# Patient Record
Sex: Female | Born: 1985 | Race: White | Hispanic: No | Marital: Married | Smoking: Current every day smoker
Health system: Southern US, Community
[De-identification: ages and names within clinical notes are randomized; demographics above are authoritative.]

## PROBLEM LIST (undated history)

## (undated) DIAGNOSIS — Z8711 Personal history of peptic ulcer disease: Secondary | ICD-10-CM

## (undated) DIAGNOSIS — Z8719 Personal history of other diseases of the digestive system: Secondary | ICD-10-CM

## (undated) HISTORY — DX: Personal history of other diseases of the digestive system: Z87.19

## (undated) HISTORY — PX: KNEE ARTHROSCOPY: SUR90

## (undated) HISTORY — DX: Personal history of peptic ulcer disease: Z87.11

## (undated) HISTORY — PX: NECK SURGERY: SHX720

---

## 2015-09-21 DIAGNOSIS — Z30011 Encounter for initial prescription of contraceptive pills: Secondary | ICD-10-CM | POA: Diagnosis not present

## 2015-09-21 DIAGNOSIS — Z3009 Encounter for other general counseling and advice on contraception: Secondary | ICD-10-CM | POA: Diagnosis not present

## 2015-11-30 DIAGNOSIS — Z3041 Encounter for surveillance of contraceptive pills: Secondary | ICD-10-CM | POA: Diagnosis not present

## 2015-11-30 DIAGNOSIS — B373 Candidiasis of vulva and vagina: Secondary | ICD-10-CM | POA: Diagnosis not present

## 2015-11-30 DIAGNOSIS — Z113 Encounter for screening for infections with a predominantly sexual mode of transmission: Secondary | ICD-10-CM | POA: Diagnosis not present

## 2015-12-29 ENCOUNTER — Ambulatory Visit (INDEPENDENT_AMBULATORY_CARE_PROVIDER_SITE_OTHER): Payer: BLUE CROSS/BLUE SHIELD | Admitting: Family

## 2015-12-29 ENCOUNTER — Encounter (INDEPENDENT_AMBULATORY_CARE_PROVIDER_SITE_OTHER): Payer: Self-pay

## 2015-12-29 DIAGNOSIS — F909 Attention-deficit hyperactivity disorder, unspecified type: Secondary | ICD-10-CM | POA: Insufficient documentation

## 2015-12-29 DIAGNOSIS — F902 Attention-deficit hyperactivity disorder, combined type: Secondary | ICD-10-CM | POA: Diagnosis not present

## 2015-12-29 MED ORDER — LISDEXAMFETAMINE DIMESYLATE 30 MG PO CAPS: 30.0000 mg | ORAL_CAPSULE | Freq: Every day | ORAL | 0 refills | Status: DC

## 2015-12-29 NOTE — Patient Instructions (Addendum)

## 2015-12-29 NOTE — Progress Notes (Signed)
   Subjective:    Patient ID: Lorraine Snyder, female    DOB: 1985/07/13, 30 y.o.   MRN: [id number]  HPI Pt presents to the office today to establish care. Pt currently taking OC and doing well.  Pt states she was released from jail a few months ago and is trying to get a PCP. PT states in jail she was diagnosed with ADHD. PT reports she has never been on medication and as a child she never went to the doctor. She also states she self medicated with marijuana as a teen.   PT states she has a job and is doing well. PT states she is performing well as long as she has orders to do them, but when she has to fill out papers and make decisions it is hard for her to focus and stay on task. Pt states she can not multiple task at all.    Review of Systems  Psychiatric/Behavioral: Positive for decreased concentration.  All other systems reviewed and are negative.  Social History   Social History  . Marital status: Single    Spouse name: N/A  . Number of children: N/A  . Years of education: N/A   Social History Main Topics  . Smoking status: Not on file  . Smokeless tobacco: Not on file  . Alcohol use Not on file  . Drug use: Unknown  . Sexual activity: Not on file   Other Topics Concern  . Not on file   Social History Narrative  . No narrative on file    No family history on file.     Objective:   Physical Exam  Constitutional: She is oriented to person, place, and time. She appears well-developed and well-nourished. No distress.  HENT:  Head: Normocephalic and atraumatic.  Right Ear: External ear normal.  Left Ear: External ear normal.  Nose: Nose normal.  Mouth/Throat: Oropharynx is clear and moist.  Eyes: Pupils are equal, round, and reactive to light.  Neck: Normal range of motion. Neck supple. No thyromegaly present.  Cardiovascular: Normal rate, regular rhythm, normal heart sounds and intact distal pulses.   No murmur heard. Pulmonary/Chest: Effort normal and breath sounds  normal. No respiratory distress. She has no wheezes.  Abdominal: Soft. Bowel sounds are normal. She exhibits no distension. There is no tenderness.  Musculoskeletal: Normal range of motion. She exhibits no edema or tenderness.  Neurological: She is alert and oriented to person, place, and time.  Skin: Skin is warm and dry.  Psychiatric: [name]. Her behavior is normal. Judgment and thought content normal.  Vitals reviewed.     BP 122/84   Pulse 65   Temp 97.1 F (36.2 C) (Oral)   Ht 5\' 7"  (1.702 m)   Wt 172 lb 6.4 oz (78.2 kg)   BMI 27.00 kg/m      Assessment & Plan:  1. Attention deficit hyperactivity disorder (ADHD), combined type -Meds as prescribed Behavior modification as needed Follow-up for recheck in 1 months - lisdexamfetamine (VYVANSE) 30 MG capsule; Take 1 capsule (30 mg total) by mouth daily.  Dispense: 30 capsule; Refill: 0   Continue all meds Labs pending Health Maintenance reviewed Diet and exercise encouraged RTO 1 months  Evelina Dun, FNP

## 2016-01-30 ENCOUNTER — Encounter: Payer: Self-pay | Admitting: Family

## 2016-01-30 ENCOUNTER — Ambulatory Visit: Payer: BLUE CROSS/BLUE SHIELD | Admitting: Family

## 2016-01-30 ENCOUNTER — Ambulatory Visit (INDEPENDENT_AMBULATORY_CARE_PROVIDER_SITE_OTHER): Payer: BLUE CROSS/BLUE SHIELD | Admitting: Family

## 2016-01-30 VITALS — BP 124/73 | HR 78 | Temp 98.1°F | Ht 67.0 in | Wt 165.0 lb

## 2016-01-30 DIAGNOSIS — J0120 Acute ethmoidal sinusitis, unspecified: Secondary | ICD-10-CM

## 2016-01-30 DIAGNOSIS — F902 Attention-deficit hyperactivity disorder, combined type: Secondary | ICD-10-CM

## 2016-01-30 MED ORDER — FLUCONAZOLE 150 MG PO TABS: 150.0000 mg | ORAL_TABLET | Freq: Once | ORAL | 0 refills | Status: AC

## 2016-01-30 MED ORDER — AMOXICILLIN-POT CLAVULANATE 875-125 MG PO TABS: 1.0000 | ORAL_TABLET | Freq: Two times a day (BID) | ORAL | 0 refills | Status: DC

## 2016-01-30 MED ORDER — LISDEXAMFETAMINE DIMESYLATE 40 MG PO CAPS: 40.0000 mg | ORAL_CAPSULE | ORAL | 0 refills | Status: DC

## 2016-01-30 NOTE — Patient Instructions (Signed)

## 2016-01-30 NOTE — Progress Notes (Signed)
Subjective:    Patient ID: Lorraine Snyder, female    DOB: 28-Nov-1985, 30 y.o.   MRN: [id number]  PT presents to the office today to recheck ADHD. PT was started on Vyvanse 30 mg. PT states she is doing better with work and her boss could even tell that she was more focused.  URI   This is a new problem. The current episode started in the past 7 days. The problem has been waxing and waning. There has been no fever. Associated symptoms include congestion, coughing, headaches, a plugged ear sensation, rhinorrhea, sinus pain, sneezing, a sore throat and swollen glands. Pertinent negatives include no abdominal pain or ear pain. She has tried decongestant for the symptoms. The treatment provided mild relief.      Review of Systems  HENT: Positive for congestion, rhinorrhea, sinus pain, sneezing and sore throat. Negative for ear pain.   Respiratory: Positive for cough.   Gastrointestinal: Negative for abdominal pain.  Neurological: Positive for headaches.  All other systems reviewed and are negative.      Objective:   Physical Exam  Constitutional: She is oriented to person, place, and time. She appears well-developed and well-nourished. No distress.  HENT:  Head: Normocephalic and atraumatic.  Right Ear: External ear normal.  Left Ear: External ear normal.  Nose: Rhinorrhea present. Right sinus exhibits maxillary sinus tenderness. Left sinus exhibits maxillary sinus tenderness.  Mouth/Throat: Posterior oropharyngeal edema and posterior oropharyngeal erythema present.  Eyes: Pupils are equal, round, and reactive to light.  Neck: Normal range of motion. Neck supple. No thyromegaly present.  Cardiovascular: Normal rate, regular rhythm, normal heart sounds and intact distal pulses.   No murmur heard. Pulmonary/Chest: Effort normal and breath sounds normal. No respiratory distress. She has no wheezes.  Abdominal: Soft. Bowel sounds are normal. She exhibits no distension. There is no tenderness.    Musculoskeletal: Normal range of motion. She exhibits no edema or tenderness.  Neurological: She is alert and oriented to person, place, and time. She has normal reflexes. No cranial nerve deficit.  Skin: Skin is warm and dry.  Psychiatric: [name]. Her behavior is normal. Judgment and thought content normal.  Vitals reviewed.     BP 124/73   Pulse 78   Temp 98.1 F (36.7 C) (Oral)   Ht 5\' 7"  (1.702 m)   Wt 165 lb (74.8 kg)   BMI 25.84 kg/m      Assessment & Plan:  1. Attention deficit hyperactivity disorder (ADHD), combined type -PT's Vyvanse increased to 40 mg from 30 mg Meds as prescribed Behavior modification as needed Follow-up for recheck in 3 months - lisdexamfetamine (VYVANSE) 40 MG capsule; Take 1 capsule (40 mg total) by mouth every morning.  Dispense: 30 capsule; Refill: 0 - lisdexamfetamine (VYVANSE) 40 MG capsule; Take 1 capsule (40 mg total) by mouth every morning.  Dispense: 30 capsule; Refill: 0 - lisdexamfetamine (VYVANSE) 40 MG capsule; Take 1 capsule (40 mg total) by mouth every morning.  Dispense: 30 capsule; Refill: 0  2. Acute ethmoidal sinusitis, recurrence not specified - Take meds as prescribed - Use a cool mist humidifier  -Use saline nose sprays frequently -Saline irrigations of the nose can be very helpful if done frequently.  * 4X daily for 1 week*  * Use of a nettie pot can be helpful with this. Follow directions with this* -Force fluids -For any cough or congestion  Use plain Mucinex- regular strength or max strength is fine   *  Children- consult with Pharmacist for dosing -For fever or aces or pains- take tylenol or ibuprofen appropriate for age and weight.  * for fevers greater than 101 orally you may alternate ibuprofen and tylenol every  3 hours. -Throat lozenges if help - amoxicillin-clavulanate (AUGMENTIN) 875-125 MG tablet; Take 1 tablet by mouth 2 (two) times daily.  Dispense: 14 tablet; Refill:  0   Evelina Dun, FNP

## 2016-02-28 ENCOUNTER — Encounter: Payer: Self-pay | Admitting: Family

## 2016-03-03 ENCOUNTER — Other Ambulatory Visit: Payer: Self-pay | Admitting: Family

## 2016-03-03 DIAGNOSIS — F902 Attention-deficit hyperactivity disorder, combined type: Secondary | ICD-10-CM

## 2016-03-05 ENCOUNTER — Other Ambulatory Visit: Payer: Self-pay | Admitting: Family

## 2016-04-17 ENCOUNTER — Encounter: Payer: Self-pay | Admitting: Family

## 2016-05-03 ENCOUNTER — Encounter: Payer: Self-pay | Admitting: Family

## 2016-05-03 ENCOUNTER — Ambulatory Visit (INDEPENDENT_AMBULATORY_CARE_PROVIDER_SITE_OTHER): Payer: BLUE CROSS/BLUE SHIELD | Admitting: Family

## 2016-05-03 ENCOUNTER — Ambulatory Visit (INDEPENDENT_AMBULATORY_CARE_PROVIDER_SITE_OTHER): Payer: BLUE CROSS/BLUE SHIELD

## 2016-05-03 VITALS — BP 119/73 | HR 73 | Temp 98.4°F | Wt 166.2 lb

## 2016-05-03 DIAGNOSIS — M5440 Lumbago with sciatica, unspecified side: Secondary | ICD-10-CM | POA: Diagnosis not present

## 2016-05-03 DIAGNOSIS — R200 Anesthesia of skin: Secondary | ICD-10-CM

## 2016-05-03 DIAGNOSIS — R202 Paresthesia of skin: Secondary | ICD-10-CM

## 2016-05-03 DIAGNOSIS — F902 Attention-deficit hyperactivity disorder, combined type: Secondary | ICD-10-CM

## 2016-05-03 MED ORDER — CYCLOBENZAPRINE HCL 10 MG PO TABS: 10.0000 mg | ORAL_TABLET | Freq: Three times a day (TID) | ORAL | 0 refills | Status: DC | PRN

## 2016-05-03 MED ORDER — GABAPENTIN 300 MG PO CAPS: 300.0000 mg | ORAL_CAPSULE | Freq: Three times a day (TID) | ORAL | 3 refills | Status: DC

## 2016-05-03 MED ORDER — LISDEXAMFETAMINE DIMESYLATE 50 MG PO CAPS: 50.0000 mg | ORAL_CAPSULE | Freq: Every day | ORAL | 0 refills | Status: DC

## 2016-05-03 MED ORDER — NAPROXEN 500 MG PO TABS: 500.0000 mg | ORAL_TABLET | Freq: Two times a day (BID) | ORAL | 1 refills | Status: DC

## 2016-05-03 NOTE — Progress Notes (Signed)
Subjective:    Patient ID: Lorraine Snyder, female    DOB: March 17, 1985, 31 y.o.   MRN: [id number]  HPI PT presents to the office today to recheck ADHD. PT was started on Vyvanse 40 mg. PT states she is doing better with work and her boss could even tell that she was more focused. PT states she could tell a difference when we first increased it, but over the last few weeks she has noticed being more hyperactive. States she is doing well in her job. Denies any weight changes or sleep changes.   Pt complaining of bilateral intermittent numbness and burning. Pt states this has been going on for over a year, but has become worse. Pt complaining of lower back pain.    Review of Systems  All other systems reviewed and are negative.      Objective:   Physical Exam  Constitutional: She is oriented to person, place, and time. She appears well-developed and well-nourished. No distress.  HENT:  Head: Normocephalic and atraumatic.  Right Ear: External ear normal.  Left Ear: External ear normal.  Eyes: Pupils are equal, round, and reactive to light.  Neck: Normal range of motion. Neck supple. No thyromegaly present.  Cardiovascular: Normal rate, regular rhythm, normal heart sounds and intact distal pulses.   No murmur heard. Pulmonary/Chest: Effort normal and breath sounds normal. No respiratory distress. She has no wheezes.  Abdominal: Soft. Bowel sounds are normal. She exhibits no distension. There is no tenderness.  Musculoskeletal: Normal range of motion. She exhibits no edema or tenderness.  Neurological: She is alert and oriented to person, place, and time. She has normal reflexes. No cranial nerve deficit.  Skin: Skin is warm and dry.  Psychiatric: [name]. Her behavior is normal. Judgment and thought content normal.  Vitals reviewed.     BP 119/73   Pulse 73   Temp 98.4 F (36.9 C) (Oral)   Wt 166 lb 3.2 oz (75.4 kg)   LMP  (Exact Date)   BMI 26.03 kg/m      Assessment & Plan:  1. Attention deficit hyperactivity disorder (ADHD), combined type -Vyvanse increased to 50 mg from 40 mg Meds as prescribed Behavior modification as needed Follow-up for recheck in 3 months - lisdexamfetamine (VYVANSE) 50 MG capsule; Take 1 capsule (50 mg total) by mouth daily.  Dispense: 30 capsule; Refill: 0 - lisdexamfetamine (VYVANSE) 50 MG capsule; Take 1 capsule (50 mg total) by mouth daily.  Dispense: 30 capsule; Refill: 0 - lisdexamfetamine (VYVANSE) 50 MG capsule; Take 1 capsule (50 mg total) by mouth daily.  Dispense: 30 capsule; Refill: 0   2. Acute bilateral low back pain with sciatica, sciatica laterality unspecified - DG Lumbar Spine 2-3 Views; Future - naproxen (NAPROSYN) 500 MG tablet; Take 1 tablet (500 mg total) by mouth 2 (two) times daily with a meal.  Dispense: 60 tablet; Refill: 1 - cyclobenzaprine (FLEXERIL) 10 MG tablet; Take 1 tablet (10 mg total) by mouth 3 (three) times daily as needed for muscle spasms.  Dispense: 30 tablet; Refill: 0 - gabapentin (NEURONTIN) 300 MG capsule; Take 1 capsule (300 mg total) by mouth 3 (three) times daily.  Dispense: 90 capsule; Refill: 3  3. Numbness and tingling of both feet - DG Lumbar Spine 2-3 Views; Future - gabapentin (NEURONTIN) 300 MG capsule; Take 1 capsule (300 mg total) by mouth 3 (three) times daily.  Dispense: 90 capsule; Refill: 3    Rest Ice and  heat No other NSAIDS  Started pt on naprosyn and gabapentin RTO in 3 months if back pain does not improve may need CT scan?  Evelina Dun, FNP

## 2016-05-03 NOTE — Patient Instructions (Signed)

## 2016-06-02 ENCOUNTER — Encounter: Payer: Self-pay | Admitting: Family

## 2016-06-04 ENCOUNTER — Other Ambulatory Visit: Payer: Self-pay | Admitting: Family

## 2016-06-04 MED ORDER — VALACYCLOVIR HCL 1 G PO TABS: 2000.0000 mg | ORAL_TABLET | Freq: Two times a day (BID) | ORAL | 1 refills | Status: DC

## 2016-06-06 ENCOUNTER — Encounter: Payer: Self-pay | Admitting: Family

## 2016-06-06 MED ORDER — FLUCONAZOLE 150 MG PO TABS: 150.0000 mg | ORAL_TABLET | Freq: Once | ORAL | 0 refills | Status: AC

## 2016-06-30 ENCOUNTER — Other Ambulatory Visit: Payer: Self-pay | Admitting: Family

## 2016-06-30 DIAGNOSIS — M5440 Lumbago with sciatica, unspecified side: Secondary | ICD-10-CM

## 2016-07-30 ENCOUNTER — Other Ambulatory Visit: Payer: Self-pay | Admitting: Family

## 2016-07-30 DIAGNOSIS — M5440 Lumbago with sciatica, unspecified side: Secondary | ICD-10-CM

## 2016-08-03 ENCOUNTER — Ambulatory Visit: Payer: BLUE CROSS/BLUE SHIELD | Admitting: Family

## 2016-08-07 ENCOUNTER — Ambulatory Visit (INDEPENDENT_AMBULATORY_CARE_PROVIDER_SITE_OTHER): Payer: BLUE CROSS/BLUE SHIELD

## 2016-08-07 ENCOUNTER — Ambulatory Visit (INDEPENDENT_AMBULATORY_CARE_PROVIDER_SITE_OTHER): Payer: BLUE CROSS/BLUE SHIELD | Admitting: Family

## 2016-08-07 ENCOUNTER — Encounter: Payer: Self-pay | Admitting: Family

## 2016-08-07 VITALS — BP 103/68 | HR 67 | Temp 98.3°F | Ht 67.0 in | Wt 156.8 lb

## 2016-08-07 DIAGNOSIS — F17200 Nicotine dependence, unspecified, uncomplicated: Secondary | ICD-10-CM | POA: Insufficient documentation

## 2016-08-07 DIAGNOSIS — F902 Attention-deficit hyperactivity disorder, combined type: Secondary | ICD-10-CM | POA: Diagnosis not present

## 2016-08-07 DIAGNOSIS — G8929 Other chronic pain: Secondary | ICD-10-CM

## 2016-08-07 DIAGNOSIS — M25561 Pain in right knee: Secondary | ICD-10-CM

## 2016-08-07 DIAGNOSIS — G5730 Lesion of lateral popliteal nerve, unspecified lower limb: Secondary | ICD-10-CM | POA: Insufficient documentation

## 2016-08-07 DIAGNOSIS — G629 Polyneuropathy, unspecified: Secondary | ICD-10-CM | POA: Insufficient documentation

## 2016-08-07 DIAGNOSIS — N912 Amenorrhea, unspecified: Secondary | ICD-10-CM | POA: Diagnosis not present

## 2016-08-07 DIAGNOSIS — G6289 Other specified polyneuropathies: Secondary | ICD-10-CM

## 2016-08-07 LAB — PREGNANCY, URINE: PREG TEST UR: POSITIVE — AB

## 2016-08-07 MED ORDER — LISDEXAMFETAMINE DIMESYLATE 50 MG PO CAPS: 50.0000 mg | ORAL_CAPSULE | Freq: Every day | ORAL | 0 refills | Status: DC

## 2016-08-07 MED ORDER — GABAPENTIN 400 MG PO CAPS: 400.0000 mg | ORAL_CAPSULE | Freq: Three times a day (TID) | ORAL | 2 refills | Status: DC

## 2016-08-07 NOTE — Patient Instructions (Signed)
Secondary Amenorrhea Secondary amenorrhea is the stopping of menstrual flow for 3-6 months in a female who has previously had periods. There are many possible causes. Most of these causes are not serious. Usually, treating the underlying problem causing the loss of menses will return your periods to normal. What are the causes? Some common and uncommon causes of not menstruating include:  Malnutrition.  Low blood sugar (hypoglycemia).  Polycystic ovary disease.  Stress or fear.  Breastfeeding.  Hormone imbalance.  Ovarian failure.  Medicines.  Extreme obesity.  Cystic fibrosis.  Low body weight or drastic weight reduction from any cause.  Early menopause.  Removal of ovaries or uterus.  Contraceptives.  Illness.  Long-term (chronic) illnesses.  Cushing syndrome.  Thyroid problems.  Birth control pills, patches, or vaginal rings for birth control.  What increases the risk? You may be at greater risk of secondary amenorrhea if:  You have a family history of this condition.  You have an eating disorder.  You do athletic training.  How is this diagnosed? A diagnosis is made by your health care provider taking a medical history and doing a physical exam. This will include a pelvic exam to check for problems with your reproductive organs. Pregnancy must be ruled out. Often, numerous blood tests are done to measure different hormones in the body. Urine testing may be done. Specialized exams (ultrasound, CT scan, MRI, or hysteroscopy) may have to be done as well as measuring the body mass index (BMI). How is this treated? Treatment depends on the cause of the amenorrhea. If an eating disorder is present, this can be treated with an adequate diet and therapy. Chronic illnesses may improve with treatment of the illness. Amenorrhea may be corrected with medicines, lifestyle changes, or surgery. If the amenorrhea cannot be corrected, it is sometimes possible to create a  false menstruation with medicines. Follow these instructions at home:  Maintain a healthy diet.  Manage weight problems.  Exercise regularly but not excessively.  Get adequate sleep.  Manage stress.  Be aware of changes in your menstrual cycle. Keep a record of when your periods occur. Note the date your period starts, how long it lasts, and any problems. Contact a health care provider if: Your symptoms do not get better with treatment. This information is not intended to replace advice given to you by your health care provider. Make sure you discuss any questions you have with your health care provider. Document Released: 03/12/2006 Document Revised: 07/07/2015 Document Reviewed: 07/17/2012 Elsevier Interactive Patient Education  2018 [employer]  
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
## 2016-08-08 ENCOUNTER — Other Ambulatory Visit: Payer: Self-pay | Admitting: Family

## 2016-08-08 DIAGNOSIS — G8929 Other chronic pain: Secondary | ICD-10-CM

## 2016-08-08 DIAGNOSIS — M25561 Pain in right knee: Principal | ICD-10-CM

## 2016-08-08 DIAGNOSIS — R9389 Abnormal findings on diagnostic imaging of other specified body structures: Secondary | ICD-10-CM

## 2016-08-10 ENCOUNTER — Encounter: Payer: Self-pay | Admitting: Family

## 2016-08-16 ENCOUNTER — Encounter: Payer: Self-pay | Admitting: Family

## 2016-08-21 ENCOUNTER — Encounter: Payer: Self-pay | Admitting: Family

## 2016-08-21 ENCOUNTER — Ambulatory Visit (INDEPENDENT_AMBULATORY_CARE_PROVIDER_SITE_OTHER): Payer: BLUE CROSS/BLUE SHIELD | Admitting: Family

## 2016-08-21 VITALS — BP 126/86 | HR 79 | Temp 99.0°F | Ht 67.0 in | Wt 154.6 lb

## 2016-08-21 DIAGNOSIS — R1032 Left lower quadrant pain: Secondary | ICD-10-CM

## 2016-08-21 DIAGNOSIS — R300 Dysuria: Secondary | ICD-10-CM

## 2016-08-21 DIAGNOSIS — O0380 Unspecified complication following complete or unspecified spontaneous abortion: Secondary | ICD-10-CM

## 2016-08-21 LAB — URINALYSIS, COMPLETE
BILIRUBIN UA: NEGATIVE
GLUCOSE, UA: NEGATIVE
KETONES UA: NEGATIVE
Leukocytes, UA: NEGATIVE
NITRITE UA: NEGATIVE
PROTEIN UA: NEGATIVE
SPEC GRAV UA: 1.01 (ref 1.005–1.030)
UUROB: 0.2 mg/dL (ref 0.2–1.0)
pH, UA: 7 (ref 5.0–7.5)

## 2016-08-21 LAB — MICROSCOPIC EXAMINATION
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
## 2016-08-21 NOTE — Patient Instructions (Addendum)
Prostaglandin-Induced Abortion, Care After Refer to this sheet in the next few weeks. These instructions provide you with information on caring for yourself after your procedure. Your health care provider may also give you more specific instructions. Your treatment has been planned according to current medical practices, but problems sometimes occur. Call your health care provider if you have any problems or questions after your procedure. What can I expect after the procedure?  You may have cramps for a few days. They may feel like menstrual cramps.  You may bleed for a few hours or a few days. It may feel like you are having a heavy period.  You may have a headache, diarrhea, or chills for 1-2 days.  You may feel tired and depressed.  Your next menstrual period will most likely start 4-6 weeks after the procedure, unless your health care provider has started you on birth control pills. Follow these instructions at home:  Only take over-the-counter or prescription medicines as directed by your health care provider.  Only take the medicines your health care provider recommends. Do not take aspirin. It can cause bleeding.  Keep track of how many menstrual pads you use each day and how soaked they are. Write down this information.  Rest and avoid strenuous activity for 2-3 weeks.  Do not have sexual intercourse for 2-3 weeks or until your health care provider says it is okay.  Do not douche or use tampons.  Ask your health care provider when you can start using birth control (contraception).  Keep all your follow-up appointments. Contact a health care provider if:  You have chills or fever.  You need to change your pad more often than every 2-4 hours.  You continue to have pain.  You have vaginal drainage.  You have pain or bleeding that gets worse. Get help right away if:  You have very bad cramps in your stomach, back, or abdomen.  You have a fever.  There are large  blood clots or tissue coming out of your vagina. Save any tissue for your health care provider to inspect.  You need to change your pad every hour or more than once in an hour.  You become light-headed, weak, or faint. This information is not intended to replace advice given to you by your health care provider. Make sure you discuss any questions you have with your health care provider. Document Released: 02/03/2013 Document Revised: 07/07/2015 Document Reviewed: 12/24/2012 Elsevier Interactive Patient Education  [employer].

## 2016-08-23 ENCOUNTER — Other Ambulatory Visit: Payer: Self-pay | Admitting: Family

## 2016-08-23 LAB — URINE CULTURE

## 2016-08-23 LAB — SPECIMEN STATUS REPORT

## 2016-08-23 LAB — PREGNANCY, URINE: PREG TEST UR: POSITIVE — AB

## 2016-08-24 ENCOUNTER — Ambulatory Visit: Payer: BLUE CROSS/BLUE SHIELD | Admitting: Family

## 2016-08-24 ENCOUNTER — Other Ambulatory Visit: Payer: Self-pay | Admitting: Family

## 2016-08-24 ENCOUNTER — Telehealth: Payer: Self-pay | Admitting: Family

## 2016-08-24 MED ORDER — FLUCONAZOLE 150 MG PO TABS: 150.0000 mg | ORAL_TABLET | ORAL | 0 refills | Status: DC | PRN

## 2016-08-24 MED ORDER — CIPROFLOXACIN HCL 500 MG PO TABS: 500.0000 mg | ORAL_TABLET | Freq: Two times a day (BID) | ORAL | 0 refills | Status: DC

## 2016-08-24 NOTE — Telephone Encounter (Signed)
Prescription sent to pharmacy.

## 2016-08-24 NOTE — Progress Notes (Signed)
c 

## 2016-08-24 NOTE — Telephone Encounter (Signed)
What is the name of the medication? Diflucan  Have you contacted your pharmacy to request a refill? NO  Which pharmacy would you like this sent to? [facility] in [place]   Patient notified that their request is being sent to the clinical staff for review and that they should receive a call once it is complete. If they do not receive a call within 24 hours they can check with their pharmacy or our office.

## 2016-08-28 ENCOUNTER — Encounter: Payer: Self-pay | Admitting: Orthopedic Surgery

## 2016-08-28 ENCOUNTER — Ambulatory Visit (INDEPENDENT_AMBULATORY_CARE_PROVIDER_SITE_OTHER): Payer: BLUE CROSS/BLUE SHIELD | Admitting: Orthopedic Surgery

## 2016-08-28 VITALS — BP 120/76 | HR 75 | Temp 97.8°F | Ht 68.0 in | Wt 155.0 lb

## 2016-08-28 DIAGNOSIS — D492 Neoplasm of unspecified behavior of bone, soft tissue, and skin: Secondary | ICD-10-CM | POA: Diagnosis not present

## 2016-08-28 DIAGNOSIS — M4120 Other idiopathic scoliosis, site unspecified: Secondary | ICD-10-CM

## 2016-08-28 NOTE — Addendum Note (Signed)
Addended by: Moreen Fowler R on: 08/28/2016 04:56 PM   Modules accepted: Orders

## 2016-08-28 NOTE — Progress Notes (Signed)
  NEW PATIENT OFFICE VISIT    Chief Complaint  Patient presents with  . New Patient (Initial Visit)    Referred by Dr. Luan Pulling for right knee pain    31 year old female with a long history of her prior knee injury in which road gravel was trapped under her skin and precipitously continue to come out over several years. She now presents with pain in the tibial tubercle region as well as a bone lesion on x-ray. She complains of deep medial and lateral knee pain occasionally associated with swelling which is best described as a deep dull ache rash also has a history of scoliosis which was never treated brought on by a growth spurt when she was in high school which was an exacerbation of her smaller curve    Review of Systems  Musculoskeletal: Positive for joint pain.  Neurological: Negative for tingling, sensory change and focal weakness.     Past Medical History:  Diagnosis Date  . History of stomach ulcers     Past Surgical History:  Procedure Laterality Date  . KNEE ARTHROSCOPY      Family History  Problem Relation Age of Onset  . Arthritis Father   . Diabetes Father   . Diabetes Sister   . Diabetes Maternal Grandmother    Social History  Substance Use Topics  . Smoking status: Current Every Day Smoker    Types: E-cigarettes  . Smokeless tobacco: Never Used  . Alcohol use 3.6 oz/week    6 Cans of beer per week    BP 120/76   Pulse 75   Temp 97.8 F (36.6 C)   Ht 5\' 8"  (1.727 m)   Wt 155 lb (70.3 kg)   BMI 23.57 kg/m   Physical Exam  Constitutional: She is oriented to person, place, and time. She appears well-developed and well-nourished.  Neurological: She is alert and oriented to person, place, and time.  Psychiatric: [name].  Vitals reviewed.   Ortho Exam Coronal plane scoliosis and a rib hump is noted  Left knee is stable full range of motion no tenderness or swelling normal motor exam skin is intact no lesions no skin  discolorations and neurovascular exam normal  Right knee has a mass and tenderness over the tibial tubercle which is old and from the road rash problem  The knee feels stable and no feeling of meniscal pathology motor exam is normal her flexion causes some discomfort over the tibial tubercle skin was otherwise normal pulse was intact and sensation was normal   MRI to evaluate this bone lesion and also we can probably see if there are any foreign bodies in the skin overlying the tibial tubercle she will follow-up after the MRI is done No orders of the defined types were placed in this encounter.   Encounter Diagnosis  Name Primary?  . Bone tumor Yes     PLAN:

## 2016-08-30 LAB — CREATININE, SERUM: CREATININE: 0.92 mg/dL (ref 0.50–1.10)

## 2016-08-31 ENCOUNTER — Ambulatory Visit (HOSPITAL_COMMUNITY)
Admission: RE | Admit: 2016-08-31 | Discharge: 2016-08-31 | Disposition: A | Discharge status: Home / Self Care | Payer: BLUE CROSS/BLUE SHIELD | Source: Ambulatory Visit | Attending: Orthopedic Surgery | Admitting: Orthopedic Surgery

## 2016-08-31 DIAGNOSIS — D492 Neoplasm of unspecified behavior of bone, soft tissue, and skin: Secondary | ICD-10-CM | POA: Diagnosis not present

## 2016-08-31 DIAGNOSIS — M7121 Synovial cyst of popliteal space [Baker], right knee: Secondary | ICD-10-CM | POA: Insufficient documentation

## 2016-08-31 DIAGNOSIS — M25461 Effusion, right knee: Secondary | ICD-10-CM | POA: Diagnosis not present

## 2016-08-31 MED ORDER — GADOBENATE DIMEGLUMINE 529 MG/ML IV SOLN
15.0000 mL | Freq: Once | INTRAVENOUS | Status: AC | PRN
  Administered 2016-08-31: 14 mL via INTRAVENOUS

## 2016-09-03 ENCOUNTER — Ambulatory Visit (INDEPENDENT_AMBULATORY_CARE_PROVIDER_SITE_OTHER): Payer: BLUE CROSS/BLUE SHIELD | Admitting: Orthopedic Surgery

## 2016-09-03 ENCOUNTER — Encounter: Payer: Self-pay | Admitting: Orthopedic Surgery

## 2016-09-03 ENCOUNTER — Other Ambulatory Visit: Payer: Self-pay | Admitting: Family

## 2016-09-03 DIAGNOSIS — M9251 Juvenile osteochondrosis of tibia and fibula, right leg: Secondary | ICD-10-CM | POA: Diagnosis not present

## 2016-09-03 DIAGNOSIS — M5440 Lumbago with sciatica, unspecified side: Secondary | ICD-10-CM

## 2016-09-03 DIAGNOSIS — D492 Neoplasm of unspecified behavior of bone, soft tissue, and skin: Secondary | ICD-10-CM | POA: Diagnosis not present

## 2016-09-03 DIAGNOSIS — M1711 Unilateral primary osteoarthritis, right knee: Secondary | ICD-10-CM | POA: Diagnosis not present

## 2016-09-03 DIAGNOSIS — M4120 Other idiopathic scoliosis, site unspecified: Secondary | ICD-10-CM | POA: Diagnosis not present

## 2016-09-03 DIAGNOSIS — M92521 Juvenile osteochondrosis of tibia tubercle, right leg: Secondary | ICD-10-CM

## 2016-09-03 MED ORDER — NABUMETONE 500 MG PO TABS: 500.0000 mg | ORAL_TABLET | Freq: Two times a day (BID) | ORAL | 5 refills | Status: DC

## 2016-09-03 NOTE — Progress Notes (Signed)
This is a follow-up visit  Today we are going to review an MRI report and result  Are also going to address the osteoarthritis of Lorraine Snyder knee the bone tumor in the tibial tubercle prominence  This is a 31 year old female she was involved in some type of accident where gravel became entrapped in the skin over the tibial tubercle. There is a long period of gravel having to be removed from the knee and now she has a large prominence there with some pain however, Lorraine Snyder main pain is in the joint. The and it's achy  She also has some history of numbness and tingling hands and feet which has been relieved well with gabapentin and no side effects  On review of systems we have nothing to add from Lorraine Snyder prior visit on July 17  I have this interpretation of the MRI The MRI shows a prominent tibial tubercle probable Osgood-Schlatter's disease. There is mild 3 compartment arthrosis  The MRI report reads IMPRESSION: 1. The lesion of concern in the distal femoral metaphysis is a benign fibrous cortical defect and requires no further workup. 2. There is evidence of remote Osgood-Schlatter disease, with an ossicle in the distal patellar tendon. The ossicle is surrounded by some edema which could reflect some low-grade chronic inflammation. 3. Mild tricompartmental spurring. 4. 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 5. Borderline elevated tibial tubercle -trochlear groove distance at 1.7 cm. 6. Small Baker's cyst. 7. Today' s exam was performed using the soft tissue mass protocol and not the orthopedic protocol. As result, the menisci are not assessed in a diagnostic fashion.     Electronically Signed   By: Van Clines M.D.   On: 08/31/2016 20:05     Baker's cyst  No meniscal tear and a normal bone lesion consistent with fibrous cortical defect  Results have been reviewed with  the patient  She has a history of gastritis and therefore she monitors closely Lorraine Snyder NSAID intake  However, with Lorraine Snyder degree of arthritis Lorraine Snyder age and activity level NSAID therapy is about all that we can do at this point  Remove the tibial tubercle we will have to address the patellar tendon and will probably have to cut it and she will be out of work 6 weeks  She says she can't do that and besides Lorraine Snyder knee joint aching is more symptomatic  Of course that is more difficult to address surgically and should be addressed medically and therefore we are putting Lorraine Snyder on Relafen 500 mg twice a day  She will follow-up when she can't stand the pain anymore and then perhaps we can address the tibial tubercle issue.  Opiate therapy should be avoided. If  the GI tract becomes an issue then she will have to take Prilosec or proton pump inhibitor of some kind with the medicine and have Lorraine Snyder hemoglobin monitored and stools monitored for occult bleeding   Encounter Diagnoses  Name Primary?  . Bone tumor Yes  . Scoliosis (and kyphoscoliosis), idiopathic   . Primary osteoarthritis of right knee   . Osgood-Schlatter's disease of right lower extremity

## 2016-09-17 ENCOUNTER — Other Ambulatory Visit: Payer: Self-pay | Admitting: Family

## 2016-09-17 DIAGNOSIS — M5440 Lumbago with sciatica, unspecified side: Secondary | ICD-10-CM

## 2016-10-04 DIAGNOSIS — M4312 Spondylolisthesis, cervical region: Secondary | ICD-10-CM | POA: Diagnosis not present

## 2016-10-04 DIAGNOSIS — M545 Low back pain: Secondary | ICD-10-CM | POA: Diagnosis not present

## 2016-10-04 DIAGNOSIS — M542 Cervicalgia: Secondary | ICD-10-CM | POA: Diagnosis not present

## 2016-10-04 DIAGNOSIS — M419 Scoliosis, unspecified: Secondary | ICD-10-CM | POA: Diagnosis not present

## 2016-10-04 DIAGNOSIS — M4125 Other idiopathic scoliosis, thoracolumbar region: Secondary | ICD-10-CM | POA: Diagnosis not present

## 2016-10-08 ENCOUNTER — Other Ambulatory Visit: Payer: Self-pay | Admitting: Rehabilitation

## 2016-10-08 DIAGNOSIS — M4310 Spondylolisthesis, site unspecified: Secondary | ICD-10-CM

## 2016-10-08 DIAGNOSIS — M542 Cervicalgia: Secondary | ICD-10-CM

## 2016-10-10 ENCOUNTER — Ambulatory Visit
Admission: RE | Admit: 2016-10-10 | Discharge: 2016-10-10 | Disposition: A | Discharge status: Home / Self Care | Payer: BLUE CROSS/BLUE SHIELD | Source: Ambulatory Visit | Attending: Rehabilitation | Admitting: Rehabilitation

## 2016-10-10 DIAGNOSIS — M542 Cervicalgia: Secondary | ICD-10-CM

## 2016-10-10 DIAGNOSIS — M4310 Spondylolisthesis, site unspecified: Secondary | ICD-10-CM

## 2016-10-10 DIAGNOSIS — M4802 Spinal stenosis, cervical region: Secondary | ICD-10-CM | POA: Diagnosis not present

## 2016-10-18 DIAGNOSIS — M25512 Pain in left shoulder: Secondary | ICD-10-CM | POA: Diagnosis not present

## 2016-10-18 DIAGNOSIS — M5412 Radiculopathy, cervical region: Secondary | ICD-10-CM | POA: Diagnosis not present

## 2016-10-18 DIAGNOSIS — M4312 Spondylolisthesis, cervical region: Secondary | ICD-10-CM | POA: Diagnosis not present

## 2016-10-18 DIAGNOSIS — M4125 Other idiopathic scoliosis, thoracolumbar region: Secondary | ICD-10-CM | POA: Diagnosis not present

## 2016-10-23 DIAGNOSIS — M50323 Other cervical disc degeneration at C6-C7 level: Secondary | ICD-10-CM | POA: Diagnosis not present

## 2016-10-23 DIAGNOSIS — M9905 Segmental and somatic dysfunction of pelvic region: Secondary | ICD-10-CM | POA: Diagnosis not present

## 2016-10-23 DIAGNOSIS — Q72812 Congenital shortening of left lower limb: Secondary | ICD-10-CM | POA: Diagnosis not present

## 2016-10-23 DIAGNOSIS — M9901 Segmental and somatic dysfunction of cervical region: Secondary | ICD-10-CM | POA: Diagnosis not present

## 2016-10-25 DIAGNOSIS — M9905 Segmental and somatic dysfunction of pelvic region: Secondary | ICD-10-CM | POA: Diagnosis not present

## 2016-10-25 DIAGNOSIS — M50323 Other cervical disc degeneration at C6-C7 level: Secondary | ICD-10-CM | POA: Diagnosis not present

## 2016-10-25 DIAGNOSIS — M9901 Segmental and somatic dysfunction of cervical region: Secondary | ICD-10-CM | POA: Diagnosis not present

## 2016-10-25 DIAGNOSIS — Q72812 Congenital shortening of left lower limb: Secondary | ICD-10-CM | POA: Diagnosis not present

## 2016-10-30 DIAGNOSIS — Q72812 Congenital shortening of left lower limb: Secondary | ICD-10-CM | POA: Diagnosis not present

## 2016-10-30 DIAGNOSIS — M9901 Segmental and somatic dysfunction of cervical region: Secondary | ICD-10-CM | POA: Diagnosis not present

## 2016-10-30 DIAGNOSIS — M50323 Other cervical disc degeneration at C6-C7 level: Secondary | ICD-10-CM | POA: Diagnosis not present

## 2016-10-30 DIAGNOSIS — M9905 Segmental and somatic dysfunction of pelvic region: Secondary | ICD-10-CM | POA: Diagnosis not present

## 2016-11-01 DIAGNOSIS — M9901 Segmental and somatic dysfunction of cervical region: Secondary | ICD-10-CM | POA: Diagnosis not present

## 2016-11-01 DIAGNOSIS — Q72812 Congenital shortening of left lower limb: Secondary | ICD-10-CM | POA: Diagnosis not present

## 2016-11-01 DIAGNOSIS — M50323 Other cervical disc degeneration at C6-C7 level: Secondary | ICD-10-CM | POA: Diagnosis not present

## 2016-11-01 DIAGNOSIS — M9905 Segmental and somatic dysfunction of pelvic region: Secondary | ICD-10-CM | POA: Diagnosis not present

## 2016-11-02 ENCOUNTER — Other Ambulatory Visit: Payer: Self-pay | Admitting: Family

## 2016-11-02 DIAGNOSIS — G6289 Other specified polyneuropathies: Secondary | ICD-10-CM

## 2016-11-06 DIAGNOSIS — Q72812 Congenital shortening of left lower limb: Secondary | ICD-10-CM | POA: Diagnosis not present

## 2016-11-06 DIAGNOSIS — M50323 Other cervical disc degeneration at C6-C7 level: Secondary | ICD-10-CM | POA: Diagnosis not present

## 2016-11-06 DIAGNOSIS — M9905 Segmental and somatic dysfunction of pelvic region: Secondary | ICD-10-CM | POA: Diagnosis not present

## 2016-11-06 DIAGNOSIS — M9901 Segmental and somatic dysfunction of cervical region: Secondary | ICD-10-CM | POA: Diagnosis not present

## 2016-11-07 ENCOUNTER — Telehealth: Payer: Self-pay | Admitting: Family

## 2016-11-07 ENCOUNTER — Ambulatory Visit: Payer: BLUE CROSS/BLUE SHIELD | Admitting: Family

## 2016-11-07 DIAGNOSIS — F902 Attention-deficit hyperactivity disorder, combined type: Secondary | ICD-10-CM

## 2016-11-08 ENCOUNTER — Encounter: Payer: Self-pay | Admitting: Family

## 2016-11-08 MED ORDER — LISDEXAMFETAMINE DIMESYLATE 50 MG PO CAPS: 50.0000 mg | ORAL_CAPSULE | Freq: Every day | ORAL | 0 refills | Status: DC

## 2016-11-08 NOTE — Telephone Encounter (Signed)
Patient aware that rx is up front to be picked up.

## 2016-11-08 NOTE — Telephone Encounter (Signed)
RX ready for pick up 

## 2016-11-15 DIAGNOSIS — M9905 Segmental and somatic dysfunction of pelvic region: Secondary | ICD-10-CM | POA: Diagnosis not present

## 2016-11-15 DIAGNOSIS — M50323 Other cervical disc degeneration at C6-C7 level: Secondary | ICD-10-CM | POA: Diagnosis not present

## 2016-11-15 DIAGNOSIS — Q72812 Congenital shortening of left lower limb: Secondary | ICD-10-CM | POA: Diagnosis not present

## 2016-11-15 DIAGNOSIS — M9901 Segmental and somatic dysfunction of cervical region: Secondary | ICD-10-CM | POA: Diagnosis not present

## 2016-11-19 ENCOUNTER — Ambulatory Visit (INDEPENDENT_AMBULATORY_CARE_PROVIDER_SITE_OTHER): Payer: BLUE CROSS/BLUE SHIELD | Admitting: Family

## 2016-11-19 ENCOUNTER — Encounter: Payer: Self-pay | Admitting: Family

## 2016-11-19 VITALS — BP 98/64 | HR 64 | Temp 97.8°F | Ht 68.0 in | Wt 154.4 lb

## 2016-11-19 DIAGNOSIS — Z3009 Encounter for other general counseling and advice on contraception: Secondary | ICD-10-CM | POA: Diagnosis not present

## 2016-11-19 DIAGNOSIS — F17200 Nicotine dependence, unspecified, uncomplicated: Secondary | ICD-10-CM | POA: Diagnosis not present

## 2016-11-19 DIAGNOSIS — F902 Attention-deficit hyperactivity disorder, combined type: Secondary | ICD-10-CM

## 2016-11-19 DIAGNOSIS — L255 Unspecified contact dermatitis due to plants, except food: Secondary | ICD-10-CM

## 2016-11-19 MED ORDER — TRIAMCINOLONE ACETONIDE 0.5 % EX OINT: 1.0000 "application " | TOPICAL_OINTMENT | Freq: Two times a day (BID) | CUTANEOUS | 0 refills | Status: DC

## 2016-11-19 MED ORDER — LISDEXAMFETAMINE DIMESYLATE 60 MG PO CAPS: 60.0000 mg | ORAL_CAPSULE | ORAL | 0 refills | Status: DC

## 2016-11-19 MED ORDER — PREDNISONE 10 MG (21) PO TBPK: ORAL_TABLET | ORAL | 0 refills | Status: DC

## 2016-11-19 NOTE — Progress Notes (Signed)
   Subjective:    Patient ID: Lorraine Snyder, female    DOB: 1985-04-17, 31 y.o.   MRN: [id number]  Pt presents to the office today for ADHD follow up. PT is currently taking Vyvanse 50 mg daily. States this is helps greatly with her staying on task and staying focus at work, but states she would like to try to increase the dose. Denies any weight loss or adverse effects.  Rash  This is a new problem. The current episode started more than 1 month ago. The problem has been waxing and waning since onset. The affected locations include the right hand, right wrist and right upper leg. The rash is characterized by itchiness and redness. She was exposed to plant contact. Past treatments include anti-itch cream. The treatment provided mild relief.      Review of Systems  Skin: Positive for rash.  All other systems reviewed and are negative.      Objective:   Physical Exam  Constitutional: She is oriented to person, place, and time. She appears well-developed and well-nourished. No distress.  HENT:  Head: Normocephalic and atraumatic.  Eyes: Pupils are equal, round, and reactive to light.  Neck: Normal range of motion. Neck supple. No thyromegaly present.  Cardiovascular: Normal rate, regular rhythm, normal heart sounds and intact distal pulses.   No murmur heard. Pulmonary/Chest: Effort normal and breath sounds normal. No respiratory distress. She has no wheezes.  Abdominal: Soft. Bowel sounds are normal. She exhibits no distension. There is no tenderness.  Musculoskeletal: Normal range of motion. She exhibits no edema or tenderness.  Neurological: She is alert and oriented to person, place, and time.  Skin: Skin is warm and dry. Rash (neck, right thigh, right wrist) noted.  Psychiatric: [name]. Her behavior is normal. Judgment and thought content normal.  Vitals reviewed.   BP 98/64   Pulse 64   Temp 97.8 F (36.6 C) (Oral)   Ht 5\' 8"  (1.727 m)   Wt 154 lb 6.4  oz (70 kg)   BMI 23.48 kg/m      Assessment & Plan:  1. Attention deficit hyperactivity disorder (ADHD), combined type Meds as prescribed Behavior modification as needed Follow-up for recheck in 3 months - lisdexamfetamine (VYVANSE) 60 MG capsule; Take 1 capsule (60 mg total) by mouth every morning.  Dispense: 30 capsule; Refill: 0 - lisdexamfetamine (VYVANSE) 60 MG capsule; Take 1 capsule (60 mg total) by mouth every morning.  Dispense: 30 capsule; Refill: 0 - lisdexamfetamine (VYVANSE) 60 MG capsule; Take 1 capsule (60 mg total) by mouth every morning.  Dispense: 30 capsule; Refill: 0  2. Current smoker Smoking cessation discussed   3. Contact dermatitis due to plant Do not scratch - predniSONE (STERAPRED UNI-PAK 21 TAB) 10 MG (21) TBPK tablet; Use as directed  Dispense: 21 tablet; Refill: 0 - triamcinolone ointment (KENALOG) 0.5 %; Apply 1 application topically 2 (two) times daily.  Dispense: 30 g; Refill: 0   4. Birth control counseling - Ambulatory referral to Obstetrics / Gynecology   Evelina Dun, FNP

## 2016-11-19 NOTE — Patient Instructions (Signed)
Poison Oak Dermatitis  Poison oak dermatitis is inflammation of the skin that is caused by contact with the allergens on the leaves of the poison oak (toxicodendron) plant. The skin reaction often includes redness, swelling, blisters, and extreme itching.  What are the causes?  This condition is caused by a specific chemical (urushiol) that is found in the sap of the poison oak plant. This chemical is sticky and it can be easily spread to people, animals, and objects. You can get poison oak dermatitis by:  · Having direct contact with a poison oak plant.  · Touching animals, other people, or objects that have come in contact with poison oak and have the chemical on them.    What increases the risk?  This condition is more likely to develop in people who:  · Are outdoors often.  · Go outdoors without wearing protective clothing, such as closed shoes, long pants, and a long-sleeved shirt.    What are the signs or symptoms?  Symptoms of this condition include:  · Redness of the skin.  · A rash that may develop blisters.  · Extreme itching.  · Swelling. This may occur if the reaction is more severe.    Symptoms usually last for 1-2 weeks. However, the first time you develop this condition, symptoms may last 3-4 weeks.  How is this diagnosed?  This condition may be diagnosed based on your symptoms and a physical exam. Your health care provider may also ask you about any recent outdoor activity.  How is this treated?  Treatment for this condition will vary depending on how severe it is. Treatment may include:  · Hydrocortisone creams or calamine lotions to relieve itching.  · Oatmeal baths to soothe the skin.  · Over-the-counter antihistamine tablets.  · Oral steroid medicine for more severe outbreaks.    Follow these instructions at home:  · Take or apply over-the-counter and prescription medicines only as told by your health care provider.  · Wash exposed skin as soon as possible with soap and cold water.  · Use  hydrocortisone creams or calamine lotion as needed to soothe the skin and relieve itching.  · Take oatmeal baths as needed. Use colloidal oatmeal. You can get this at your local pharmacy or grocery store. Follow the instructions on the packaging.  · Do not scratch or rub your skin.  · While you have the rash, wash clothes right after you wear them.  How is this prevented?  · Learn to identify the poison oak plant and avoid contact with the plant. This plant can be recognized by the number of leaves. Generally, poison oak has three leaves with flowering branches on a single stem. The leaves are often a bit fuzzy and have a toothlike edge.  · If you have been exposed to poison oak, thoroughly wash with soap and water right away. You have about 30 minutes to remove the plant resin before it will cause the rash. Be sure to wash under your fingernails because any plant resin there will continue to spread the rash.  · When hiking or camping, wear clothes that will help you avoid exposure on the skin. This includes long pants, a long-sleeved shirt, tall socks, and hiking boots. You can also apply preventive lotion to your skin to help limit exposure.  · If you suspect that your clothes or outdoor gear came in contact with poison oak, rinse them off outside with a garden hose before bringing them inside your house.    Contact a health care provider if:  · You have open sores in the rash area.  · You have more redness, swelling, or pain in the affected area.  · You have redness that spreads beyond the rash area.  · You have fluid, blood, or pus coming from the affected area.  · You have a fever.  · You have a rash over a large area of your body.  · You have a rash on your eyes, mouth, or genitals.  · Your rash does not improve after a few days.  Get help right away if:  · Your face swells or your eyes swell shut.  · You have trouble breathing.  · You have trouble swallowing.  This information is not intended to replace advice  given to you by your health care provider. Make sure you discuss any questions you have with your health care provider.  Document Released: 08/05/2002 Document Revised: 07/07/2015 Document Reviewed: 07/07/2014  Elsevier Interactive Patient Education © 2018 [employer]

## 2016-11-27 DIAGNOSIS — Q72812 Congenital shortening of left lower limb: Secondary | ICD-10-CM | POA: Diagnosis not present

## 2016-11-27 DIAGNOSIS — M9901 Segmental and somatic dysfunction of cervical region: Secondary | ICD-10-CM | POA: Diagnosis not present

## 2016-11-27 DIAGNOSIS — M9905 Segmental and somatic dysfunction of pelvic region: Secondary | ICD-10-CM | POA: Diagnosis not present

## 2016-11-27 DIAGNOSIS — M50323 Other cervical disc degeneration at C6-C7 level: Secondary | ICD-10-CM | POA: Diagnosis not present

## 2016-12-03 ENCOUNTER — Other Ambulatory Visit: Payer: Self-pay | Admitting: Family

## 2016-12-03 DIAGNOSIS — G6289 Other specified polyneuropathies: Secondary | ICD-10-CM

## 2016-12-04 ENCOUNTER — Ambulatory Visit (INDEPENDENT_AMBULATORY_CARE_PROVIDER_SITE_OTHER): Payer: BLUE CROSS/BLUE SHIELD | Admitting: Obstetrics & Gynecology

## 2016-12-04 ENCOUNTER — Encounter: Payer: Self-pay | Admitting: Obstetrics & Gynecology

## 2016-12-04 VITALS — BP 120/60 | HR 82 | Ht 68.0 in | Wt 154.0 lb

## 2016-12-04 DIAGNOSIS — M9901 Segmental and somatic dysfunction of cervical region: Secondary | ICD-10-CM | POA: Diagnosis not present

## 2016-12-04 DIAGNOSIS — N946 Dysmenorrhea, unspecified: Secondary | ICD-10-CM

## 2016-12-04 DIAGNOSIS — N921 Excessive and frequent menstruation with irregular cycle: Secondary | ICD-10-CM

## 2016-12-04 DIAGNOSIS — M9905 Segmental and somatic dysfunction of pelvic region: Secondary | ICD-10-CM | POA: Diagnosis not present

## 2016-12-04 DIAGNOSIS — M50323 Other cervical disc degeneration at C6-C7 level: Secondary | ICD-10-CM | POA: Diagnosis not present

## 2016-12-04 DIAGNOSIS — Z79899 Other long term (current) drug therapy: Secondary | ICD-10-CM | POA: Diagnosis not present

## 2016-12-04 DIAGNOSIS — Q72812 Congenital shortening of left lower limb: Secondary | ICD-10-CM | POA: Diagnosis not present

## 2016-12-04 MED ORDER — KETOROLAC TROMETHAMINE 10 MG PO TABS: 10.0000 mg | ORAL_TABLET | Freq: Three times a day (TID) | ORAL | 0 refills | Status: DC | PRN

## 2016-12-04 MED ORDER — MEGESTROL ACETATE 40 MG PO TABS: ORAL_TABLET | ORAL | 3 refills | Status: DC

## 2016-12-04 NOTE — Progress Notes (Signed)
Chief Complaint  Patient presents with  . Contraception    interested in ablation     HPI:   This is a new patient visit 31 y.o. G1P0010 Patient's last menstrual period was 12/01/2016.  Lorraine Snyder is in today to discuss increasing problems with her menses The patient states now for the past several months her menstrual periods have been debilitating Location: Pain is midline. Quality: Pain is stabbing sharp and the bleeding is heavy with clots. Severity: The bleeding is severe and the pain is severe. Timing: Monthly. Duration: 5 days. Context: No pain except with her menses. Modifying factors:   Signs/Symptoms:      Current Outpatient Medications:  .  gabapentin (NEURONTIN) 400 MG capsule, TAKE 1 CAPSULE (400 MG TOTAL) BY MOUTH 3 (THREE) TIMES DAILY., Disp: 90 capsule, Rfl: 2 .  lisdexamfetamine (VYVANSE) 60 MG capsule, Take 1 capsule (60 mg total) by mouth every morning., Disp: 30 capsule, Rfl: 0 .  naproxen (NAPROSYN) 500 MG tablet, TAKE 1 TABLET (500 MG TOTAL) BY MOUTH 2 (TWO) TIMES DAILY WITH A MEAL., Disp: 60 tablet, Rfl: 0 .  ketorolac (TORADOL) 10 MG tablet, Take 1 tablet (10 mg total) by mouth every 8 (eight) hours as needed., Disp: 15 tablet, Rfl: 0 .  megestrol (MEGACE) 40 MG tablet, 3 tablets a day for 5 days, 2 tablets a day for 5 days then 1 tablet daily, Disp: 45 tablet, Rfl: 3 .  nabumetone (RELAFEN) 500 MG tablet, Take 1 tablet (500 mg total) by mouth 2 (two) times daily. (Patient not taking: Reported on 12/04/2016), Disp: 60 tablet, Rfl: 5 .  predniSONE (STERAPRED UNI-PAK 21 TAB) 10 MG (21) TBPK tablet, Use as directed (Patient not taking: Reported on 12/04/2016), Disp: 21 tablet, Rfl: 0 .  triamcinolone ointment (KENALOG) 0.5 %, Apply 1 application topically 2 (two) times daily. (Patient not taking: Reported on 12/04/2016), Disp: 30 g, Rfl: 0 .  valACYclovir (VALTREX) 1000 MG tablet, Take 2 tablets (2,000 mg total) by mouth 2 (two) times daily. (Patient not  taking: Reported on 12/04/2016), Disp: 4 tablet, Rfl: 1  Problem Pertinent ROS:       No burning with urination, frequency or urgency No nausea, vomiting or diarrhea Nor fever chills or other constitutional symptoms   Extended ROS:        [place]:             Past Medical History:  Diagnosis Date  . History of stomach ulcers     Past Surgical History:  Procedure Laterality Date  . KNEE ARTHROSCOPY      OB History    Gravida Para Term Preterm AB Living   1       1 0   SAB TAB Ectopic Multiple Live Births     1            Allergies  Allergen Reactions  . Benadryl [Diphenhydramine] Rash    Social History   Socioeconomic History  . Marital status: Married    Spouse name: None  . Number of children: None  . Years of education: None  . Highest education level: None  Social Needs  . Financial resource strain: None  . Food insecurity - worry: None  . Food insecurity - inability: None  . Transportation needs - medical: None  . Transportation needs - non-medical: None  Occupational History  . None  Tobacco Use  . Smoking status: Current Every Day Smoker    Types: E-cigarettes  . Smokeless  tobacco: Never Used  Substance and Sexual Activity  . Alcohol use: Yes    Alcohol/week: 3.6 oz    Types: 6 Cans of beer per week  . Drug use: No  . Sexual activity: Yes  Other Topics Concern  . None  Social History Narrative  . None    Family History  Problem Relation Age of Onset  . Arthritis Father   . Diabetes Father   . Diabetes Sister   . Diabetes Paternal Grandmother      Examination:  Vitals:  Blood pressure 120/60, pulse 82, height 5\' 8"  (1.727 m), weight 154 lb (69.9 kg), last menstrual period 12/01/2016.    Physical Examination:     General Appearance:  awake, alert, oriented, in no acute distress Skin:  there are no suspicious lesions or rashes of concern, skin color, texture, turgor are normal; there are no bruises, rashes or lesions. Abdomen:  Scaphoid soft, non-tender, normal bowel sounds; no bruits, organomegaly or masses.  Vulva:  normal appearing vulva with no masses, tenderness or lesions Vagina:  normal mucosa, no discharge, no  lesions Cervix:  no cervical motion tenderness and no lesions Uterus:  normal size, contour, position, consistency, mobility, non-tender Adnexa: ovaries:present,  normal adnexa in size, nontender and no masses  No results found for this or any previous visit (from the past 72 hour(s)).   DATA orders and reviews: Labs were not ordered today:   Imaging studies were ordered today:  Pelvic sonogram  Lab tests were not reviewed today:    Imaging studies were not reviewed today:    I did not independently review/view images, tracing or specimen(not simply the report) myself.    Prescription Drug Management:   Prescriptions prescribed this encounter:    Meds ordered this encounter  Medications  . megestrol (MEGACE) 40 MG tablet    Sig: 3 tablets a day for 5 days, 2 tablets a day for 5 days then 1 tablet daily    Dispense:  45 tablet    Refill:  3  . ketorolac (TORADOL) 10 MG tablet    Sig: Take 1 tablet (10 mg total) by mouth every 8 (eight) hours as needed.    Dispense:  15 tablet    Refill:  0    Renewed Prescriptions:  none  Current prescription changes:  none   Impression/Plan(Problem Based):  There are no diagnoses linked to this encounter. 1.   Menometrorrhagia      (new problem) : Additional workup is needed: Pelvic sonogram and megestrol therapy  {2.  Dysmenorrhea      (new problem:) : Additional workup is needed: Pelvic sonogram and megestrol therapy   Over the next month we will see how able to response to megestrol therapy is and get an ultrasound at which time I will review that with her next visit and see how she did with megestrol If she has any difficulty with that she will give me a call She is also given a prescription for Toradol if needed for pain  Follow Up:    Return in about 1 month (around 01/04/2017) for GYN sono, Follow up, with Dr Elonda Husky.       All questions were answered.

## 2016-12-11 DIAGNOSIS — Z79899 Other long term (current) drug therapy: Secondary | ICD-10-CM | POA: Diagnosis not present

## 2016-12-11 DIAGNOSIS — M5412 Radiculopathy, cervical region: Secondary | ICD-10-CM | POA: Diagnosis not present

## 2016-12-16 ENCOUNTER — Encounter: Payer: Self-pay | Admitting: Obstetrics & Gynecology

## 2016-12-25 DIAGNOSIS — M5412 Radiculopathy, cervical region: Secondary | ICD-10-CM | POA: Diagnosis not present

## 2016-12-28 DIAGNOSIS — M50123 Cervical disc disorder at C6-C7 level with radiculopathy: Secondary | ICD-10-CM | POA: Diagnosis not present

## 2016-12-28 DIAGNOSIS — M5013 Cervical disc disorder with radiculopathy, cervicothoracic region: Secondary | ICD-10-CM | POA: Diagnosis not present

## 2016-12-28 DIAGNOSIS — M50122 Cervical disc disorder at C5-C6 level with radiculopathy: Secondary | ICD-10-CM | POA: Diagnosis not present

## 2016-12-28 DIAGNOSIS — M4722 Other spondylosis with radiculopathy, cervical region: Secondary | ICD-10-CM | POA: Diagnosis not present

## 2017-01-07 ENCOUNTER — Ambulatory Visit (INDEPENDENT_AMBULATORY_CARE_PROVIDER_SITE_OTHER): Payer: BLUE CROSS/BLUE SHIELD

## 2017-01-07 ENCOUNTER — Encounter: Payer: Self-pay | Admitting: Obstetrics & Gynecology

## 2017-01-07 ENCOUNTER — Ambulatory Visit (INDEPENDENT_AMBULATORY_CARE_PROVIDER_SITE_OTHER): Payer: BLUE CROSS/BLUE SHIELD | Admitting: Obstetrics & Gynecology

## 2017-01-07 ENCOUNTER — Other Ambulatory Visit: Payer: Self-pay

## 2017-01-07 VITALS — BP 118/76 | HR 70 | Ht 68.0 in | Wt 148.0 lb

## 2017-01-07 DIAGNOSIS — Z113 Encounter for screening for infections with a predominantly sexual mode of transmission: Secondary | ICD-10-CM

## 2017-01-07 DIAGNOSIS — N921 Excessive and frequent menstruation with irregular cycle: Secondary | ICD-10-CM

## 2017-01-07 DIAGNOSIS — N946 Dysmenorrhea, unspecified: Secondary | ICD-10-CM

## 2017-01-07 NOTE — Progress Notes (Signed)
Preoperative History and Physical  Lorraine Snyder is a 31 y.o. G1P0010 with Patient's last menstrual period was 01/01/2017. admitted for a laparoscopic bilateral salpingectomy for sterilization and hysteroscopy uterine curettage endometrial ablation Sonogram is normal, responded pretty well to the megestrol.   31 y.o. G1P0010 Patient's last menstrual period was 12/01/2016.  Lorraine Snyder is in today to discuss increasing problems with her menses The patient states now for the past several months her menstrual periods have been debilitating Location: Pain is midline. Quality: Pain is stabbing sharp and the bleeding is heavy with clots. Severity: The bleeding is severe and the pain is severe. Timing: Monthly. Duration: 5 days. Context: No pain except with her menses.    PMH:    Past Medical History:  Diagnosis Date  . History of stomach ulcers     PSH:     Past Surgical History:  Procedure Laterality Date  . KNEE ARTHROSCOPY      POb/GynH:      OB History    Gravida Para Term Preterm AB Living   1       1 0   SAB TAB Ectopic Multiple Live Births     1            SH:   Social History   Tobacco Use  . Smoking status: Current Every Day Smoker    Types: E-cigarettes  . Smokeless tobacco: Never Used  Substance Use Topics  . Alcohol use: Yes    Alcohol/week: 3.6 oz    Types: 6 Cans of beer per week  . Drug use: No    FH:    Family History  Problem Relation Age of Onset  . Arthritis Father   . Diabetes Father   . Diabetes Sister   . Cancer Sister   . Diabetes Paternal Grandmother   . Heart attack Paternal Grandfather   . Cancer Maternal Grandmother        ovarian  . Alzheimer's disease Maternal Grandfather   . Hypertension Mother      Allergies:  Allergies  Allergen Reactions  . Benadryl [Diphenhydramine] Rash    Medications:       Current Outpatient Medications:  .  gabapentin (NEURONTIN) 400 MG capsule, TAKE 1 CAPSULE (400 MG TOTAL) BY MOUTH 3 (THREE)  TIMES DAILY., Disp: 90 capsule, Rfl: 2 .  lisdexamfetamine (VYVANSE) 60 MG capsule, Take 1 capsule (60 mg total) by mouth every morning., Disp: 30 capsule, Rfl: 0 .  megestrol (MEGACE) 40 MG tablet, 3 tablets a day for 5 days, 2 tablets a day for 5 days then 1 tablet daily, Disp: 45 tablet, Rfl: 3 .  naproxen (NAPROSYN) 500 MG tablet, TAKE 1 TABLET (500 MG TOTAL) BY MOUTH 2 (TWO) TIMES DAILY WITH A MEAL., Disp: 60 tablet, Rfl: 0 .  ketorolac (TORADOL) 10 MG tablet, Take 1 tablet (10 mg total) by mouth every 8 (eight) hours as needed. (Patient not taking: Reported on 01/07/2017), Disp: 15 tablet, Rfl: 0 .  valACYclovir (VALTREX) 1000 MG tablet, Take 2 tablets (2,000 mg total) by mouth 2 (two) times daily. (Patient not taking: Reported on 12/04/2016), Disp: 4 tablet, Rfl: 1  Review of Systems:   Review of Systems  Constitutional: Negative for fever, chills, weight loss, malaise/fatigue and diaphoresis.  HENT: Negative for hearing loss, ear pain, nosebleeds, congestion, sore throat, neck pain, tinnitus and ear discharge.   Eyes: Negative for blurred vision, double vision, photophobia, pain, discharge and redness.  Respiratory: Negative for cough, hemoptysis, sputum  production, shortness of breath, wheezing and stridor.   Cardiovascular: Negative for chest pain, palpitations, orthopnea, claudication, leg swelling and PND.  Gastrointestinal: Positive for abdominal pain. Negative for heartburn, nausea, vomiting, diarrhea, constipation, blood in stool and melena.  Genitourinary: Negative for dysuria, urgency, frequency, hematuria and flank pain.  Musculoskeletal: Negative for myalgias, back pain, joint pain and falls.  Skin: Negative for itching and rash.  Neurological: Negative for dizziness, tingling, tremors, sensory change, speech change, focal weakness, seizures, loss of consciousness, weakness and headaches.  Endo/Heme/Allergies: Negative for environmental allergies and polydipsia. Does not  bruise/bleed easily.  Psychiatric/Behavioral: Negative for depression, suicidal ideas, hallucinations, memory loss and substance abuse. The patient is not nervous/anxious and does not have insomnia.      PHYSICAL EXAM:  Blood pressure 118/76, pulse 70, height 5\' 8"  (1.727 m), weight 148 lb (67.1 kg), last menstrual period 01/01/2017.    Vitals reviewed. Constitutional: She is oriented to person, place, and time. She appears well-developed and well-nourished.  HENT:  Head: Normocephalic and atraumatic.  Right Ear: External ear normal.  Left Ear: External ear normal.  Nose: Nose normal.  Mouth/Throat: Oropharynx is clear and moist.  Eyes: Conjunctivae and EOM are normal. Pupils are equal, round, and reactive to light. Right eye exhibits no discharge. Left eye exhibits no discharge. No scleral icterus.  Neck: Normal range of motion. Neck supple. No tracheal deviation present. No thyromegaly present.  Cardiovascular: Normal rate, regular rhythm, normal heart sounds and intact distal pulses.  Exam reveals no gallop and no friction rub.   No murmur heard. Respiratory: Effort normal and breath sounds normal. No respiratory distress. She has no wheezes. She has no rales. She exhibits no tenderness.  GI: Soft. Bowel sounds are normal. She exhibits no distension and no mass. There is tenderness. There is no rebound and no guarding.  Genitourinary:       Vulva is normal without lesions Vagina is pink moist without discharge Cervix normal in appearance and pap is normal Uterus is normal size, contour, position, consistency, mobility, non-tender Adnexa is negative with normal sized ovaries by sonogram  Musculoskeletal: Normal range of motion. She exhibits no edema and no tenderness.  Neurological: She is alert and oriented to person, place, and time. She has normal reflexes. She displays normal reflexes. No cranial nerve deficit. She exhibits normal muscle tone. Coordination normal.  Skin: Skin is  warm and dry. No rash noted. No erythema. No pallor.  Psychiatric: [name]. Her behavior is normal. Judgment and thought content normal.    Labs: No results found for this or any previous visit (from the past 336 hour(s)).  EKG: No orders found for this or any previous visit.  Imaging Studies: US Pelvis Transvanginal Non-ob (tv Only)  Result Date: 01/07/2017 GYNECOLOGIC SONOGRAM Lorraine Snyder is a 31 y.o. G1P0010 LMP 01/01/2017,she is here for a pelvic sonogram for menometrorrhagia,dysmenorrhea. Uterus                      6.9 x 4 x 5.3 cm, Vol 76 ml, homogeneous anteverted uterus,wnl Endometrium          6.1 mm, symmetrical, wnl Right ovary             2.8 x 1.3 x 2.2 cm, wnl Left ovary                3.3 x 1.9 x 2.1 cm, wnl No free fluid Technician Comments: PELVIC US TA/TV:  homogeneous anteverted uterus,wnl,EEC 6.1 mm,normal ovaries bilat,ovaries appear mobile,no free fluid,no pain during ultrasound [employer] 01/07/2017 4:31 PM Clinical Impression and recommendations: I have reviewed the sonogram results above, combined with the patient's current clinical course, below are my impressions and any appropriate recommendations for management based on the sonographic findings. Uterus normal, no pathology Endometrium is normal thin on megestrol Both ovaries are normal Lorraine Snyder 01/07/2017 5:07 PM   US Pelvis Transvanginal Non-ob (tv Only)  Result Date: 01/07/2017 GYNECOLOGIC SONOGRAM Lorraine Snyder is a 31 y.o. G1P0010 LMP 01/01/2017,she is here for a pelvic sonogram for menometrorrhagia,dysmenorrhea. Uterus                      6.9 x 4 x 5.3 cm, Vol 76 ml, homogeneous anteverted uterus,wnl Endometrium          6.1 mm, symmetrical, wnl Right ovary             2.8 x 1.3 x 2.2 cm, wnl Left ovary                3.3 x 1.9 x 2.1 cm, wnl No free fluid Technician Comments: PELVIC US TA/TV: homogeneous anteverted uterus,wnl,EEC 6.1 mm,normal ovaries bilat,ovaries appear mobile,no  free fluid,no pain during ultrasound [employer] 01/07/2017 4:31 PM Clinical Impression and recommendations: I have reviewed the sonogram results above, combined with the patient's current clinical course, below are my impressions and any appropriate recommendations for management based on the sonographic findings. Uterus normal, no pathology Endometrium is normal thin on megestrol Both ovaries are normal Lorraine Snyder 01/07/2017 5:07 PM      Assessment: Menometrorrhagia Dysmenorrhea Desires permanent sterilization  Patient Active Problem List   Diagnosis Date Noted  . Current smoker 08/07/2016  . Amenorrhea 08/07/2016  . Peripheral neuropathy 08/07/2016  . Attention deficit hyperactivity disorder (ADHD) 12/29/2015    Plan: Laparoscopic bilateral salpingectomy for sterilization, hysteroscopy uterine curettage endometrial ablation for management of desire for sterilization and for menorrhagia/dysmenorrhea 01/25/2017@0730   Lorraine Snyder 01/07/2017 5:24 PM      All questions were answered.

## 2017-01-07 NOTE — Progress Notes (Signed)
PELVIC US TA/TV: homogeneous anteverted uterus,wnl,EEC 6.1 mm,normal ovaries bilat,ovaries appear mobile,no free fluid,no pain during ultrasound

## 2017-01-08 DIAGNOSIS — Z113 Encounter for screening for infections with a predominantly sexual mode of transmission: Secondary | ICD-10-CM | POA: Diagnosis not present

## 2017-01-09 ENCOUNTER — Encounter: Payer: Self-pay | Admitting: Obstetrics & Gynecology

## 2017-01-09 LAB — GC/CHLAMYDIA PROBE AMP
Chlamydia trachomatis, NAA: NEGATIVE
NEISSERIA GONORRHOEAE BY PCR: NEGATIVE

## 2017-01-15 ENCOUNTER — Ambulatory Visit (INDEPENDENT_AMBULATORY_CARE_PROVIDER_SITE_OTHER): Payer: BLUE CROSS/BLUE SHIELD | Admitting: Family

## 2017-01-15 ENCOUNTER — Encounter: Payer: Self-pay | Admitting: Family

## 2017-01-15 ENCOUNTER — Ambulatory Visit (INDEPENDENT_AMBULATORY_CARE_PROVIDER_SITE_OTHER): Payer: BLUE CROSS/BLUE SHIELD

## 2017-01-15 VITALS — BP 129/78 | HR 72 | Temp 98.0°F | Ht 68.0 in | Wt 152.0 lb

## 2017-01-15 DIAGNOSIS — M50121 Cervical disc disorder at C4-C5 level with radiculopathy: Secondary | ICD-10-CM

## 2017-01-15 DIAGNOSIS — M5013 Cervical disc disorder with radiculopathy, cervicothoracic region: Secondary | ICD-10-CM | POA: Diagnosis not present

## 2017-01-15 DIAGNOSIS — Z01818 Encounter for other preprocedural examination: Secondary | ICD-10-CM

## 2017-01-15 DIAGNOSIS — M50123 Cervical disc disorder at C6-C7 level with radiculopathy: Secondary | ICD-10-CM

## 2017-01-15 DIAGNOSIS — M4322 Fusion of spine, cervical region: Secondary | ICD-10-CM | POA: Diagnosis not present

## 2017-01-15 NOTE — Patient Instructions (Addendum)
Cervical Fusion, Care After °This sheet gives you information about how to care for yourself after your procedure. Your health care provider may also give you more specific instructions. If you have problems or questions, contact your health care provider. °What can I expect after the procedure? °After the procedure, it is common to have: °· Incision area pain. °· Numbness. °· Weakness. °· Sore throat. °· Difficulty swallowing. ° °Follow these instructions at home: °Medicines °· Take over-the-counter and prescription medicines, including pain medicines, only as told by your health care provider. °· If you were prescribed an antibiotic medicine, take it as told by your health care provider. Do not stop taking the antibiotic even if you start to feel better. °If you have a brace: °· Wear the brace as told by your health care provider. Remove it only as told by your health care provider. °· Keep the brace clean. °Incision care °· Follow instructions from your health care provider about how to take care of your incision. Make sure you: °? Wash your hands with soap and water before you change your bandage (dressing). If soap and water are not available, use hand sanitizer. °? Change your dressing as told by your health care provider. °? Leave stitches (sutures), skin glue, or adhesive strips in place. These skin closures may need to be in place for 2 weeks or longer. If adhesive strip edges start to loosen and curl up, you may trim the loose edges. Do not remove adhesive strips completely unless your health care provider tells you to do that. °· Keep your incision clean and dry. Do not take baths, swim, or use a hot tub until your health care provider approves. °· Check your incision area every day for signs of infection. Check for: °? More redness, swelling, or pain. °? More fluid or blood. °? Warmth. °? Pus or a bad smell. °Activity ° °· Rest and protect your back as much as possible. °· Do not lift anything that is  heavier than 10 lb (4.5 kg) or the limit that you are told by your health care provider. °· Do not twist or bend at the waist until your health care provider approves. °· Avoid: °? Pushing and pulling motions. °? Lifting anything over your head. °? Sitting or lying down in the same position for long periods of time. °· Do not exercise until your health care provider approves. Once your health care provider has approved exercise, ask him or her what kinds of exercises you can do to make your back stronger (physical therapy). °· Do not drive until your health care provider approves. °? Do not drive for 24 hours if you received a medicine to help you relax (sedative). °? Do not drive or use heavy machinery while taking prescription pain medicine. °General instructions ° °· Have someone assist you to turn in bed frequently by moving your whole body without twisting your back (log rolling technique). °· Wear compression stockings as told by your health care provider. These stockings help to prevent blood clots and reduce swelling in your legs. °· Do not use any products that contain nicotine or tobacco, such as cigarettes and e-cigarettes. These can delay bone healing. If you need help quitting, ask your health care provider. °· To prevent or treat constipation while you are taking prescription pain medicine, your health care provider may recommend that you: °? Drink enough fluid to keep your urine clear or pale yellow. °? Take over-the-counter or prescription medicines. °? Eat foods   that are high in fiber, such as fresh fruits and vegetables, whole grains, and beans. °? Limit foods that are high in fat and processed sugars, such as fried and sweet foods. °· Keep all follow-up visits as told by your health care provider. This is important. °Contact a health care provider if: °· You have pain that gets worse or does not get better with medicine. °· You have more redness, swelling, or pain around your incision. °· You have  more fluid or blood coming from your incision. °· Your incision feels warm to the touch. °· You have pus or a bad smell coming from your incision. °· You have a fever. °· You have weakness or numbness in your legs that is new or getting worse. °· You have swelling in your calf or leg. °· The edges of your incision break open. °· You vomit or feel nauseous. °· You have trouble controlling urination or bowel movements. °Get help right away if: °· You develop shortness of breath or chest pain. °· You have trouble swallowing. °· You have trouble breathing. °· You develop a cough. °This information is not intended to replace advice given to you by your health care provider. Make sure you discuss any questions you have with your health care provider. °Document Released: 09/13/2003 Document Revised: 08/24/2015 Document Reviewed: 08/10/2015 °Elsevier Interactive Patient Education © 2018 [employer] ° ° ° ° ° ° °

## 2017-01-15 NOTE — Progress Notes (Signed)
   Subjective:    Patient ID: Lorraine Snyder, female    DOB: [birth year], 31 y.o.   MRN: [id number]  HPI PT presents to the office today for surgical clearance for surgery on 01/31/17 to her neck. PT has cervical disc disorder at C5-C6 and C6-C7 level with radiculopathy and  C7-T1 disc with radiculopathy and has ACDF C5-T1 with plate and possible illiac crest bone graft and allograft.   Pt states her pain in her neck is constant and is 8-10 out 10. Pt taking gabapentin, zanaflex, and oxycodone that helps.    Review of Systems  Musculoskeletal: Positive for arthralgias and back pain.  All other systems reviewed and are negative.      Objective:   Physical Exam  Constitutional: She is oriented to person, place, and time. She appears well-developed and well-nourished. No distress.  HENT:  Head: Normocephalic.  Eyes: Pupils are equal, round, and reactive to light.  Neck: Normal range of motion. Neck supple. No thyromegaly present.  Cardiovascular: Normal rate, regular rhythm, normal heart sounds and intact distal pulses.  No murmur heard. Pulmonary/Chest: Effort normal and breath sounds normal. No respiratory distress. She has no wheezes.  Abdominal: Soft. Bowel sounds are normal. She exhibits no distension. There is no tenderness.  Musculoskeletal: Normal range of motion. She exhibits no edema or tenderness.  Neurological: She is alert and oriented to person, place, and time.  Skin: Skin is warm and dry.  Psychiatric: [name]. Her behavior is normal. Judgment and thought content normal.  Vitals reviewed.   Chest x-ray Negative Preliminary reading by Evelina Dun, FNP [facility]   BP 129/78   Pulse 72   Temp 98 F (36.7 C) (Oral)   Ht 5\' 8"  (1.727 m)   Wt 152 lb (68.9 kg)   LMP 01/01/2017   BMI 23.11 kg/m      Assessment & Plan:  1. Pre-op evaluation - DG Chest 2 View; Future - EKG 12-Lead  2. 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

## 2017-01-16 LAB — CMP14+EGFR
ALT: 7 IU/L (ref 0–32)
AST: 17 IU/L (ref 0–40)
Albumin/Globulin Ratio: 1.7 (ref 1.2–2.2)
Albumin: 4.1 g/dL (ref 3.5–5.5)
Alkaline Phosphatase: 47 IU/L (ref 39–117)
BUN/Creatinine Ratio: 18 (ref 9–23)
BUN: 12 mg/dL (ref 6–20)
Bilirubin Total: 0.6 mg/dL (ref 0.0–1.2)
CALCIUM: 9.4 mg/dL (ref 8.7–10.2)
CO2: 23 mmol/L (ref 20–29)
CREATININE: 0.68 mg/dL (ref 0.57–1.00)
Chloride: 104 mmol/L (ref 96–106)
GFR calc Af Amer: 133 mL/min/{1.73_m2} (ref >59)
GFR, EST NON AFRICAN AMERICAN: 115 mL/min/{1.73_m2} (ref >59)
GLOBULIN, TOTAL: 2.4 g/dL (ref 1.5–4.5)
Glucose: 99 mg/dL (ref 65–99)
Potassium: 5.2 mmol/L (ref 3.5–5.2)
SODIUM: 140 mmol/L (ref 134–144)
Total Protein: 6.5 g/dL (ref 6.0–8.5)

## 2017-01-16 LAB — CBC WITH DIFFERENTIAL/PLATELET
BASOS: 0 %
Basophils Absolute: 0 10*3/uL (ref 0.0–0.2)
EOS (ABSOLUTE): 0.1 10*3/uL (ref 0.0–0.4)
EOS: 2 %
HEMATOCRIT: 37.1 % (ref 34.0–46.6)
Hemoglobin: 12.2 g/dL (ref 11.1–15.9)
IMMATURE GRANS (ABS): 0 10*3/uL (ref 0.0–0.1)
IMMATURE GRANULOCYTES: 0 %
LYMPHS: 33 %
Lymphocytes Absolute: 2.2 10*3/uL (ref 0.7–3.1)
MCH: 31.1 pg (ref 26.6–33.0)
MCHC: 32.9 g/dL (ref 31.5–35.7)
MCV: 95 fL (ref 79–97)
MONOCYTES: 9 %
Monocytes Absolute: 0.6 10*3/uL (ref 0.1–0.9)
NEUTROS PCT: 56 %
Neutrophils Absolute: 3.8 10*3/uL (ref 1.4–7.0)
Platelets: 275 10*3/uL (ref 150–379)
RBC: 3.92 x10E6/uL (ref 3.77–5.28)
RDW: 13 % (ref 12.3–15.4)
WBC: 6.7 10*3/uL (ref 3.4–10.8)

## 2017-01-18 NOTE — Patient Instructions (Signed)
Lorraine Snyder  01/18/2017     @PREFPERIOPPHARMACY @   Your procedure is scheduled on  01/25/2017 .  Report to [facility] at  615  A.M.  Call this number if you have problems the morning of surgery:  [phone]   Remember:  Do not eat food or drink liquids after midnight.  Take these medicines the morning of surgery with A SIP OF WATER   Neyrontin, vyvanse, oxycodone, zanaflex.   Do not wear jewelry, make-up or nail polish.  Do not wear lotions, powders, or perfumes, or deoderant.  Do not shave 48 hours prior to surgery.  Men may shave face and neck.  Do not bring valuables to the hospital.  [facility] is not responsible for any belongings or valuables.  Contacts, dentures or bridgework may not be worn into surgery.  Leave your suitcase in the car.  After surgery it may be brought to your room.  For patients admitted to the hospital, discharge time will be determined by your treatment team.  Patients discharged the day of surgery will not be allowed to drive home.   Name and phone number of your driver:   family Special instructions:  None  Please read over the following fact sheets that you were given. Anesthesia Post-op Instructions and Care and Recovery After Surgery       Dilation and Curettage or Vacuum Curettage Dilation and curettage (D&C) and vacuum curettage are minor procedures. A D&C involves stretching (dilation) the cervix and scraping (curettage) the inside lining of the uterus (endometrium). During a D&C, tissue is gently scraped from the endometrium, starting from the top portion of the uterus down to the lowest part of the uterus (cervix). During a vacuum curettage, the lining and tissue in the uterus are removed with the use of gentle suction. Curettage may be performed to either diagnose or treat a problem. As a diagnostic procedure, curettage is performed to examine tissues from the uterus. A diagnostic curettage may be done  if you have:  Irregular bleeding in the uterus.  Bleeding with the development of clots.  Spotting between menstrual periods.  Prolonged menstrual periods or other abnormal bleeding.  Bleeding after menopause.  No menstrual period (amenorrhea).  A change in size and shape of the uterus.  Abnormal endometrial cells discovered during a Pap test.  As a treatment procedure, curettage may be performed for the following reasons:  Removal of an IUD (intrauterine device).  Removal of retained placenta after giving birth.  Abortion.  Miscarriage.  Removal of endometrial polyps.  Removal of uncommon types of noncancerous lumps (fibroids).  Tell a health care provider about:  Any allergies you have, including allergies to prescribed medicine or latex.  All medicines you are taking, including vitamins, herbs, eye drops, creams, and over-the-counter medicines. This is especially important if you take any blood-thinning medicine. Bring a list of all of your medicines to your appointment.  Any problems you or family members have had with anesthetic medicines.  Any blood disorders you have.  Any surgeries you have had.  Your medical history and any medical conditions you have.  Whether you are pregnant or may be pregnant.  Recent vaginal infections you have had.  Recent menstrual periods, bleeding problems you have had, and what form of birth control (contraception) you use. What are the risks? Generally, this is a safe procedure. However, problems may occur, including:  Infection.  Heavy vaginal bleeding.  Allergic reactions to medicines.  Damage to the cervix or other structures or organs.  Development of scar tissue (adhesions) inside the uterus, which can cause abnormal amounts of menstrual bleeding. This may make it harder to get pregnant in the future.  A hole (perforation) or puncture in the uterine wall. This is rare.  What happens before the  procedure? Staying hydrated Follow instructions from your health care provider about hydration, which may include:  Up to 2 hours before the procedure - you may continue to drink clear liquids, such as water, clear fruit juice, black coffee, and plain tea.  Eating and drinking restrictions Follow instructions from your health care provider about eating and drinking, which may include:  8 hours before the procedure - stop eating heavy meals or foods such as meat, fried foods, or fatty foods.  6 hours before the procedure - stop eating light meals or foods, such as toast or cereal.  6 hours before the procedure - stop drinking milk or drinks that contain milk.  2 hours before the procedure - stop drinking clear liquids. If your health care provider told you to take your medicine(s) on the day of your procedure, take them with only a sip of water.  Medicines  Ask your health care provider about: ? Changing or stopping your regular medicines. This is especially important if you are taking diabetes medicines or blood thinners. ? Taking medicines such as aspirin and ibuprofen. These medicines can thin your blood. Do not take these medicines before your procedure if your health care provider instructs you not to.  You may be given antibiotic medicine to help prevent infection. General instructions  For 24 hours before your procedure, do not: ? Douche. ? Use tampons. ? Use medicines, creams, or suppositories in the vagina. ? Have sexual intercourse.  You may be given a pregnancy test on the day of the procedure.  Plan to have someone take you home from the hospital or clinic.  You may have a blood or urine sample taken.  If you will be going home right after the procedure, plan to have someone with you for 24 hours. What happens during the procedure?  To reduce your risk of infection: ? Your health care team will wash or sanitize their hands. ? Your skin will be washed with  soap.  An IV tube will be inserted into one of your veins.  You will be given one of the following: ? A medicine that numbs the area in and around the cervix (local anesthetic). ? A medicine to make you fall asleep (general anesthetic).  You will lie down on your back, with your feet in foot rests (stirrups).  The size and position of your uterus will be checked.  A lubricated instrument (speculum or Sims retractor) will be inserted into the back side of your vagina. The speculum will be used to hold apart the walls of your vagina so your health care provider can see your cervix.  A tool (tenaculum) will be attached to the lip of the cervix to stabilize it.  Your cervix will be softened and dilated. This may be done by: ? Taking a medicine. ? Having tapered dilators or thin rods (laminaria) or gradual widening instruments (tapered dilators) inserted into your cervix.  A small, sharp, curved instrument (curette) will be used to scrape a small amount of tissue or cells from the endometrium or cervical canal. In some cases, gentle suction is  applied with the curette. The curette will then be removed. The cells will be taken to a lab for testing. The procedure may vary among health care providers and hospitals. What happens after the procedure?  You may have mild cramping, backache, pain, and light bleeding or spotting. You may pass small blood clots from your vagina.  You may have to wear compression stockings. These stockings help to prevent blood clots and reduce swelling in your legs.  Your blood pressure, heart rate, breathing rate, and blood oxygen level will be monitored until the medicines you were given have worn off. Summary  Dilation and curettage (D&C) involves stretching (dilation) the cervix and scraping (curettage) the inside lining of the uterus (endometrium).  After the procedure, you may have mild cramping, backache, pain, and light bleeding or spotting. You may pass  small blood clots from your vagina.  Plan to have someone take you home from the hospital or clinic. This information is not intended to replace advice given to you by your health care provider. Make sure you discuss any questions you have with your health care provider. Document Released: 01/29/2005 Document Revised: 10/16/2015 Document Reviewed: 10/16/2015 Elsevier Interactive Patient Education  2018 [employer].  Dilation and Curettage or Vacuum Curettage, Care After These instructions give you information about caring for yourself after your procedure. Your doctor may also give you more specific instructions. Call your doctor if you have any problems or questions after your procedure. Follow these instructions at home: Activity  Do not drive or use heavy machinery while taking prescription pain medicine.  For 24 hours after your procedure, avoid driving.  Take short walks often, followed by rest periods. Ask your doctor what activities are safe for you. After one or two days, you may be able to return to your normal activities.  Do not lift anything that is heavier than 10 lb (4.5 kg) until your doctor approves.  For at least 2 weeks, or as long as told by your doctor: ? Do not douche. ? Do not use tampons. ? Do not have sex. General instructions  Take over-the-counter and prescription medicines only as told by your doctor. This is very important if you take blood thinning medicine.  Do not take baths, swim, or use a hot tub until your doctor approves. Take showers instead of baths.  Wear compression stockings as told by your doctor.  It is up to you to get the results of your procedure. Ask your doctor when your results will be ready.  Keep all follow-up visits as told by your doctor. This is important. Contact a doctor if:  You have very bad cramps that get worse or do not get better with medicine.  You have very bad pain in your belly (abdomen).  You cannot drink  fluids without throwing up (vomiting).  You get pain in a different part of the area between your belly and thighs (pelvis).  You have bad-smelling discharge from your vagina.  You have a rash. Get help right away if:  You are bleeding a lot from your vagina. A lot of bleeding means soaking more than one sanitary pad in an hour, for 2 hours in a row.  You have clumps of blood (blood clots) coming from your vagina.  You have a fever or chills.  Your belly feels very tender or hard.  You have chest pain.  You have trouble breathing.  You cough up blood.  You feel dizzy.  You feel light-headed.  You pass out (faint).  You have pain in your neck or shoulder area. Summary  Take short walks often, followed by rest periods. Ask your doctor what activities are safe for you. After one or two days, you may be able to return to your normal activities.  Do not lift anything that is heavier than 10 lb (4.5 kg) until your doctor approves.  Do not take baths, swim, or use a hot tub until your doctor approves. Take showers instead of baths.  Contact your doctor if you have any symptoms of infection, like bad-smelling discharge from your vagina. This information is not intended to replace advice given to you by your health care provider. Make sure you discuss any questions you have with your health care provider. Document Released: 11/08/2007 Document Revised: 10/17/2015 Document Reviewed: 10/17/2015 Elsevier Interactive Patient Education  2017 [place]. Hysteroscopy Hysteroscopy is a procedure used for looking inside the womb (uterus). It may be done for various reasons, including:  To evaluate abnormal bleeding, fibroid (benign, noncancerous) tumors, polyps, scar tissue (adhesions), and possibly cancer of the uterus.  To look for lumps (tumors) and other uterine growths.  To look for causes of why a woman cannot get pregnant (infertility), causes of recurrent loss of pregnancy  (miscarriages), or a lost intrauterine device (IUD).  To perform a sterilization by blocking the fallopian tubes from inside the uterus.  In this procedure, a thin, flexible tube with a tiny light and camera on the end of it (hysteroscope) is used to look inside the uterus. A hysteroscopy should be done right after a menstrual period to be sure you are not pregnant. LET [facility] CARE PROVIDER KNOW ABOUT:  Any allergies you have.  All medicines you are taking, including vitamins, herbs, eye drops, creams, and over-the-counter medicines.  Previous problems you or members of your family have had with the use of anesthetics.  Any blood disorders you have.  Previous surgeries you have had.  Medical conditions you have. RISKS AND COMPLICATIONS Generally, this is a safe procedure. However, as with any procedure, complications can occur. Possible complications include:  Putting a hole in the uterus.  Excessive bleeding.  Infection.  Damage to the cervix.  Injury to other organs.  Allergic reaction to medicines.  Too much fluid used in the uterus for the procedure.  BEFORE THE PROCEDURE  Ask your health care provider about changing or stopping any regular medicines.  Do not take aspirin or blood thinners for 1 week before the procedure, or as directed by your health care provider. These can cause bleeding.  If you smoke, do not smoke for 2 weeks before the procedure.  In some cases, a medicine is placed in the cervix the day before the procedure. This medicine makes the cervix have a larger opening (dilate). This makes it easier for the instrument to be inserted into the uterus during the procedure.  Do not eat or drink anything for at least 8 hours before the surgery.  Arrange for someone to take you home after the procedure. PROCEDURE  You may be given a medicine to relax you (sedative). You may also be given one of the following: ? A medicine that numbs the area around  the cervix (local anesthetic). ? A medicine that makes you sleep through the procedure (general anesthetic).  The hysteroscope is inserted through the vagina into the uterus. The camera on the hysteroscope sends a picture to a TV screen. This gives the surgeon a good view  inside the uterus.  During the procedure, air or a liquid is put into the uterus, which allows the surgeon to see better.  Sometimes, tissue is gently scraped from inside the uterus. These tissue samples are sent to a lab for testing. What to expect after the procedure  If you had a general anesthetic, you may be groggy for a couple hours after the procedure.  If you had a local anesthetic, you will be able to go home as soon as you are stable and feel ready.  You may have some cramping. This normally lasts for a couple days.  You may have bleeding, which varies from light spotting for a few days to menstrual-like bleeding for 3-7 days. This is normal.  If your test results are not back during the visit, make an appointment with your health care provider to find out the results. This information is not intended to replace advice given to you by your health care provider. Make sure you discuss any questions you have with your health care provider. Document Released: 05/07/2000 Document Revised: 07/07/2015 Document Reviewed: 08/28/2012 Elsevier Interactive Patient Education  2017 [place].  Endometrial Ablation Endometrial ablation is a procedure that destroys the thin inner layer of the lining of the uterus (endometrium). This procedure may be done:  To stop heavy periods.  To stop bleeding that is causing anemia.  To control irregular bleeding.  To treat bleeding caused by small tumors (fibroids) in the endometrium.  This procedure is often an alternative to major surgery, such as removal of the uterus and cervix (hysterectomy). As a result of this procedure:  You may not be able to have children. However,  if you are premenopausal (you have not gone through menopause): ? You may still have a small chance of getting pregnant. ? You will need to use a reliable method of birth control after the procedure to prevent pregnancy.  You may stop having a menstrual period, or you may have only a small amount of bleeding during your period. Menstruation may return several years after the procedure.  Tell a health care provider about:  Any allergies you have.  All medicines you are taking, including vitamins, herbs, eye drops, creams, and over-the-counter medicines.  Any problems you or family members have had with the use of anesthetic medicines.  Any blood disorders you have.  Any surgeries you have had.  Any medical conditions you have. What are the risks? Generally, this is a safe procedure. However, problems may occur, including:  A hole (perforation) in the uterus or bowel.  Infection of the uterus, bladder, or vagina.  Bleeding.  Damage to other structures or organs.  An air bubble in the lung (air embolus).  Problems with pregnancy after the procedure.  Failure of the procedure.  Decreased ability to diagnose cancer in the endometrium.  What happens before the procedure?  You will have tests of your endometrium to make sure there are no pre-cancerous cells or cancer cells present.  You may have an ultrasound of the uterus.  You may be given medicines to thin the endometrium.  Ask your health care provider about: ? Changing or stopping your regular medicines. This is especially important if you take diabetes medicines or blood thinners. ? Taking medicines such as aspirin and ibuprofen. These medicines can thin your blood. Do not take these medicines before your procedure if your doctor tells you not to.  Plan to have someone take you home from the hospital or clinic.  What happens during the procedure?  You will lie on an exam table with your feet and legs supported as in  a pelvic exam.  To lower your risk of infection: ? Your health care team will wash or sanitize their hands and put on germ-free (sterile) gloves. ? Your genital area will be washed with soap.  An IV tube will be inserted into one of your veins.  You will be given a medicine to help you relax (sedative).  A surgical instrument with a light and camera (resectoscope) will be inserted into your vagina and moved into your uterus. This allows your surgeon to see inside your uterus.  Endometrial tissue will be removed using one of the following methods: ? Radiofrequency. This method uses a radiofrequency-alternating electric current to remove the endometrium. ? Cryotherapy. This method uses extreme cold to freeze the endometrium. ? Heated-free liquid. This method uses a heated saltwater (saline) solution to remove the endometrium. ? Microwave. This method uses high-energy microwaves to heat up the endometrium and remove it. ? Thermal balloon. This method involves inserting a catheter with a balloon tip into the uterus. The balloon tip is filled with heated fluid to remove the endometrium. The procedure may vary among health care providers and hospitals. What happens after the procedure?  Your blood pressure, heart rate, breathing rate, and blood oxygen level will be monitored until the medicines you were given have worn off.  As tissue healing occurs, you may notice vaginal bleeding for 4-6 weeks after the procedure. You may also experience: ? Cramps. ? Thin, watery vaginal discharge that is light pink or brown in color. ? A need to urinate more frequently than usual. ? Nausea.  Do not drive for 24 hours if you were given a sedative.  Do not have sex or insert anything into your vagina until your health care provider approves. Summary  Endometrial ablation is done to treat the many causes of heavy menstrual bleeding.  The procedure may be done only after medications have been tried to  control the bleeding.  Plan to have someone take you home from the hospital or clinic. This information is not intended to replace advice given to you by your health care provider. Make sure you discuss any questions you have with your health care provider. Document Released: 12/09/2003 Document Revised: 02/16/2016 Document Reviewed: 02/16/2016 Elsevier Interactive Patient Education  2017 [place], also called tubectomy, is the surgical removal of one of the fallopian tubes. The fallopian tubes are where eggs travel from the ovaries to the uterus. Removing one fallopian tube does not prevent you from becoming pregnant. It also does not cause problems with your menstrual periods. You may need a salpingectomy if you:  Have a fertilized egg that attaches to the fallopian tube (ectopic pregnancy), especially one that causes the tube to burst or tear (rupture).  Have an infected fallopian tube.  Have cancer of the fallopian tube or nearby organs.  Have had an ovary removed due to a cyst or tumor.  Have had your uterus removed.  There are three different methods that can be used for a salpingectomy:  Open. This method involves making one large incision in your abdomen.  Laparoscopic. This method involves using a thin, lighted tube with a tiny camera on the end (laparoscope) to help perform the procedure. The laparoscope will allow your surgeon to make several small incisions in the abdomen instead of a large incision.  Robot-assisted: This method involves using  a computer to control surgical instruments that are attached to robotic arms.  Tell a health care provider about:  Any allergies you have.  All medicines you are taking, including vitamins, herbs, eye drops, creams, and over-the-counter medicines.  Any problems you or family members have had with anesthetic medicines.  Any blood disorders you have.  Any surgeries you have had.  Any medical  conditions you have.  Whether you are pregnant or may be pregnant. What are the risks? Generally, this is a safe procedure. However, problems may occur, including:  Infection.  Bleeding.  Allergic reactions to medicines.  Damage to other structures or organs.  Blood clots in the legs or lungs.  What happens before the procedure? Staying hydrated Follow instructions from your health care provider about hydration, which may include:  Up to 2 hours before the procedure - you may continue to drink clear liquids, such as water, clear fruit juice, black coffee, and plain tea.  Eating and drinking restrictions Follow instructions from your health care provider about eating and drinking, which may include:  8 hours before the procedure - stop eating heavy meals or foods such as meat, fried foods, or fatty foods.  6 hours before the procedure - stop eating light meals or foods, such as toast or cereal.  6 hours before the procedure - stop drinking milk or drinks that contain milk.  2 hours before the procedure - stop drinking clear liquids.  Medicines  Ask your health care provider about: ? Changing or stopping your regular medicines. This is especially important if you are taking diabetes medicines or blood thinners. ? Taking medicines such as aspirin and ibuprofen. These medicines can thin your blood. Do not take these medicines before your procedure if your health care provider instructs you not to.  You may be given antibiotic medicine to help prevent infection. General instructions  Do not smoke for at least 2 weeks before your procedure. If you need help quitting, ask your health care provider.  You may have an exam or tests, such as an electrocardiogram (ECG).  You may have a blood or urine sample taken.  Ask your health care provider: ? Whether you should stop removing hair from your surgical area. ? How your surgical site will be marked or identified.  You may be  asked to shower with a germ-killing soap.  Plan to have someone take you home from the hospital or clinic.  If you will be going home right after the procedure, plan to have someone with you for 24 hours. What happens during the procedure?  To reduce your risk of infection: ? Your health care team will wash or sanitize their hands. ? Hair may be removed from the surgical area. ? Your skin will be washed with soap.  An IV tube will be inserted into one of your veins.  You will be given a medicine to make you fall asleep (general anesthetic). You may also be given a medicine to help you relax (sedative).  A thin tube (catheter) may be inserted through your urethra and into your bladder to drain urine during your procedure.  Depending on the type of procedure you are having, one incision or several small incisions will be made in your abdomen.  Your fallopian tube will be cut and removed from where it attaches to your uterus.  Your blood vessels will be clamped and tied to prevent excess bleeding.  The incision(s) in your abdomen will be closed with stitches (  sutures), staples, or skin glue.  A bandage (dressing) may be placed over your incision(s). The procedure may vary among health care providers and hospitals. What happens after the procedure?  Your blood pressure, heart rate, breathing rate, and blood oxygen level will be monitored until the medicines you were given have worn off.  You may continue to receive fluids and medicines through an IV tube.  You may continue to have a catheter draining your urine.  You may have to wear compression stockings. These stockings help to prevent blood clots and reduce swelling in your legs.  You will be given pain medicine as needed.  Do not drive for 24 hours if you received a sedative. Summary  Salpingectomy is a surgical procedure to remove one of the fallopian tubes.  The procedure may be done with an open incision, with a  laparoscope, or with computer-controlled instruments.  Depending on the type of procedure you are having, one incision or several small incisions will be made in your abdomen.  Your blood pressure, heart rate, breathing rate, and blood oxygen level will be monitored until the medicines you were given have worn off.  Plan to have someone take you home from the hospital or clinic. This information is not intended to replace advice given to you by your health care provider. Make sure you discuss any questions you have with your health care provider. Document Released: 06/17/2008 Document Revised: 09/16/2015 Document Reviewed: 07/23/2012 Elsevier Interactive Patient Education  2018 [place] After This sheet gives you information about how to care for yourself after your procedure. Your health care provider may also give you more specific instructions. If you have problems or questions, contact your health care provider. What can I expect after the procedure? After your procedure, it is common to have:  Pain in your abdomen.  Some occasional vaginal bleeding (spotting).  Tiredness.  Follow these instructions at home: Incision care   Keep your incision area and your bandage (dressing) clean and dry.  Follow instructions from your health care provider about how to take care of your incision. Make sure you: ? Wash your hands with soap and water before you change your dressing. If soap and water are not available, use hand sanitizer. ? Change your dressing astold by your health care provider. ? Leave stitches (sutures), staples, skin glue, or adhesive strips in place. These skin closures may need to stay in place for 2 weeks or longer. If adhesive strip edges start to loosen and curl up, you may trim the loose edges. Do not remove adhesive strips completely unless your health care provider tells you to do that.  Check your incision area every day for signs of  infection. Check for: ? More redness, swelling, or pain. ? More fluid or blood. ? Warmth. ? Pus or a bad smell. Activity   Do not drive or use heavy machinery while taking prescription pain medicine.  Do not drive for 24 hours if you received a medicine to help you relax (sedative).  Rest as directed by your health care provider. Ask your health care provider what activities are safe for you. You should avoid: ? Lifting anything that is heavier than 10 lb (4.5 kg) until your health care provider approves. ? Activities that require a lot of energy.  Until your health care provider approves: ? Do not douche. ? Do not use tampons. ? Do not have sexual intercourse. General instructions  Take over-the-counter and prescription medicines only  as told by your health care provider.  To prevent or treat constipation while you are taking prescription pain medicine, your health care provider may recommend that you: ? Drink enough fluid to keep your urine clear or pale yellow. ? Take over-the-counter or prescription medicines. ? Eat foods that are high in fiber, such as fresh fruits and vegetables, whole grains, and beans. ? Limit foods that are high in fat and processed sugars, such as fried and sweet foods.  Do not take baths, swim, or use a hot tub until your health care provider approves. You may take showers.  Wear compression stockings as told by your health care provider. These stockings help to prevent blood clots and reduce swelling in your legs.  Keep all follow-up visits as told by your health care provider. This is important. Contact a health care provider if:  You have: ? Pain when you urinate. ? More redness, swelling, or pain around your incision. ? More fluid or blood coming from your incision. ? Pus or a bad smell coming from your incision. ? A fever. ? Abdominal pain that gets worse or does not get better with medicine.  Your incision feels warm to the touch.  Your  incision starts to break open.  You develop a rash.  You develop nausea and vomiting.  You feel light-headed. Get help right away if:  You develop pain in your chest or leg.  You develop shortness of breath.  You faint.  You have increased vaginal bleeding. This information is not intended to replace advice given to you by your health care provider. Make sure you discuss any questions you have with your health care provider. Document Released: 05/05/2010 Document Revised: 09/28/2015 Document Reviewed: 09/29/2015 Elsevier Interactive Patient Education  2018 [place] Anesthesia, Adult General anesthesia is the use of medicines to make a person "go to sleep" (be unconscious) for a medical procedure. General anesthesia is often recommended when a procedure:  Is long.  Requires you to be still or in an unusual position.  Is major and can cause you to lose blood.  Is impossible to do without general anesthesia.  The medicines used for general anesthesia are called general anesthetics. In addition to making you sleep, the medicines:  Prevent pain.  Control your blood pressure.  Relax your muscles.  Tell a health care provider about:  Any allergies you have.  All medicines you are taking, including vitamins, herbs, eye drops, creams, and over-the-counter medicines.  Any problems you or family members have had with anesthetic medicines.  Types of anesthetics you have had in the past.  Any bleeding disorders you have.  Any surgeries you have had.  Any medical conditions you have.  Any history of heart or lung conditions, such as heart failure, sleep apnea, or chronic obstructive pulmonary disease (COPD).  Whether you are pregnant or may be pregnant.  Whether you use tobacco, alcohol, marijuana, or street drugs.  Any history of Armed forces logistics/support/administrative officer.  Any history of depression or anxiety. What are the risks? Generally, this is a safe procedure. However,  problems may occur, including:  Allergic reaction to anesthetics.  Lung and heart problems.  Inhaling food or liquids from your stomach into your lungs (aspiration).  Injury to nerves.  Waking up during your procedure and being unable to move (rare).  Extreme agitation or a state of mental confusion (delirium) when you wake up from the anesthetic.  Air in the bloodstream, which can lead to stroke.  These problems are more likely to develop if you are having a major surgery or if you have an advanced medical condition. You can prevent some of these complications by answering all of your health care provider's questions thoroughly and by following all pre-procedure instructions. General anesthesia can cause side effects, including:  Nausea or vomiting  A sore throat from the breathing tube.  Feeling cold or shivery.  Feeling tired, washed out, or achy.  Sleepiness or drowsiness.  Confusion or agitation.  What happens before the procedure? Staying hydrated Follow instructions from your health care provider about hydration, which may include:  Up to 2 hours before the procedure - you may continue to drink clear liquids, such as water, clear fruit juice, black coffee, and plain tea.  Eating and drinking restrictions Follow instructions from your health care provider about eating and drinking, which may include:  8 hours before the procedure - stop eating heavy meals or foods such as meat, fried foods, or fatty foods.  6 hours before the procedure - stop eating light meals or foods, such as toast or cereal.  6 hours before the procedure - stop drinking milk or drinks that contain milk.  2 hours before the procedure - stop drinking clear liquids.  Medicines  Ask your health care provider about: ? Changing or stopping your regular medicines. This is especially important if you are taking diabetes medicines or blood thinners. ? Taking medicines such as aspirin and  ibuprofen. These medicines can thin your blood. Do not take these medicines before your procedure if your health care provider instructs you not to. ? Taking new dietary supplements or medicines. Do not take these during the week before your procedure unless your health care provider approves them.  If you are told to take a medicine or to continue taking a medicine on the day of the procedure, take the medicine with sips of water. General instructions   Ask if you will be going home the same day, the following day, or after a longer hospital stay. ? Plan to have someone take you home. ? Plan to have someone stay with you for the first 24 hours after you leave the hospital or clinic.  For 3-6 weeks before the procedure, try not to use any tobacco products, such as cigarettes, chewing tobacco, and e-cigarettes.  You may brush your teeth on the morning of the procedure, but make sure to spit out the toothpaste. What happens during the procedure?  You will be given anesthetics through a mask and through an IV tube in one of your veins.  You may receive medicine to help you relax (sedative).  As soon as you are asleep, a breathing tube may be used to help you breathe.  An anesthesia specialist will stay with you throughout the procedure. He or she will help keep you comfortable and safe by continuing to give you medicines and adjusting the amount of medicine that you get. He or she will also watch your blood pressure, pulse, and oxygen levels to make sure that the anesthetics do not cause any problems.  If a breathing tube was used to help you breathe, it will be removed before you wake up. The procedure may vary among health care providers and hospitals. What happens after the procedure?  You will wake up, often slowly, after the procedure is complete, usually in a recovery area.  Your blood pressure, heart rate, breathing rate, and blood oxygen level will be monitored until the medicines  you were given have worn off.  You may be given medicine to help you calm down if you feel anxious or agitated.  If you will be going home the same day, your health care provider may check to make sure you can stand, drink, and urinate.  Your health care providers will treat your pain and side effects before you go home.  Do not drive for 24 hours if you received a sedative.  You may: ? Feel nauseous and vomit. ? Have a sore throat. ? Have mental slowness. ? Feel cold or shivery. ? Feel sleepy. ? Feel tired. ? Feel sore or achy, even in parts of your body where you did not have surgery. This information is not intended to replace advice given to you by your health care provider. Make sure you discuss any questions you have with your health care provider. Document Released: 05/08/2007 Document Revised: 07/12/2015 Document Reviewed: 01/13/2015 Elsevier Interactive Patient Education  2018 [place] Anesthesia, Adult, Care After These instructions provide you with information about caring for yourself after your procedure. Your health care provider may also give you more specific instructions. Your treatment has been planned according to current medical practices, but problems sometimes occur. Call your health care provider if you have any problems or questions after your procedure. What can I expect after the procedure? After the procedure, it is common to have:  Vomiting.  A sore throat.  Mental slowness.  It is common to feel:  Nauseous.  Cold or shivery.  Sleepy.  Tired.  Sore or achy, even in parts of your body where you did not have surgery.  Follow these instructions at home: For at least 24 hours after the procedure:  Do not: ? Participate in activities where you could fall or become injured. ? Drive. ? Use heavy machinery. ? Drink alcohol. ? Take sleeping pills or medicines that cause drowsiness. ? Make important decisions or sign legal  documents. ? Take care of children on your own.  Rest. Eating and drinking  If you vomit, drink water, juice, or soup when you can drink without vomiting.  Drink enough fluid to keep your urine clear or pale yellow.  Make sure you have little or no nausea before eating solid foods.  Follow the diet recommended by your health care provider. General instructions  Have a responsible adult stay with you until you are awake and alert.  Return to your normal activities as told by your health care provider. Ask your health care provider what activities are safe for you.  Take over-the-counter and prescription medicines only as told by your health care provider.  If you smoke, do not smoke without supervision.  Keep all follow-up visits as told by your health care provider. This is important. Contact a health care provider if:  You continue to have nausea or vomiting at home, and medicines are not helpful.  You cannot drink fluids or start eating again.  You cannot urinate after 8-12 hours.  You develop a skin rash.  You have fever.  You have increasing redness at the site of your procedure. Get help right away if:  You have difficulty breathing.  You have chest pain.  You have unexpected bleeding.  You feel that you are having a life-threatening or urgent problem. This information is not intended to replace advice given to you by your health care provider. Make sure you discuss any questions you have with your health care provider. Document Released: 05/07/2000 Document  Revised: 07/04/2015 Document Reviewed: 01/13/2015 Elsevier Interactive Patient Education  [employer].

## 2017-01-21 ENCOUNTER — Encounter (HOSPITAL_COMMUNITY)
Admission: RE | Admit: 2017-01-21 | Discharge: 2017-01-21 | Disposition: A | Discharge status: Home / Self Care | Payer: BLUE CROSS/BLUE SHIELD | Source: Ambulatory Visit | Attending: Obstetrics & Gynecology | Admitting: Obstetrics & Gynecology

## 2017-01-22 ENCOUNTER — Other Ambulatory Visit: Payer: Self-pay | Admitting: Obstetrics & Gynecology

## 2017-01-23 ENCOUNTER — Encounter (HOSPITAL_COMMUNITY)
Admission: RE | Admit: 2017-01-23 | Discharge: 2017-01-23 | Disposition: A | Discharge status: Home / Self Care | Payer: BLUE CROSS/BLUE SHIELD | Source: Ambulatory Visit | Attending: Obstetrics & Gynecology | Admitting: Obstetrics & Gynecology

## 2017-01-23 ENCOUNTER — Other Ambulatory Visit: Payer: Self-pay

## 2017-01-23 ENCOUNTER — Encounter (HOSPITAL_COMMUNITY): Payer: Self-pay

## 2017-01-23 ENCOUNTER — Telehealth: Payer: Self-pay | Admitting: Family

## 2017-01-23 ENCOUNTER — Other Ambulatory Visit: Payer: Self-pay | Admitting: Obstetrics & Gynecology

## 2017-01-23 DIAGNOSIS — N838 Other noninflammatory disorders of ovary, fallopian tube and broad ligament: Secondary | ICD-10-CM | POA: Diagnosis not present

## 2017-01-23 DIAGNOSIS — F909 Attention-deficit hyperactivity disorder, unspecified type: Secondary | ICD-10-CM | POA: Diagnosis not present

## 2017-01-23 DIAGNOSIS — Z888 Allergy status to other drugs, medicaments and biological substances status: Secondary | ICD-10-CM | POA: Diagnosis not present

## 2017-01-23 DIAGNOSIS — Z302 Encounter for sterilization: Secondary | ICD-10-CM | POA: Diagnosis not present

## 2017-01-23 DIAGNOSIS — Z8719 Personal history of other diseases of the digestive system: Secondary | ICD-10-CM | POA: Diagnosis not present

## 2017-01-23 DIAGNOSIS — Z4002 Encounter for prophylactic removal of ovary: Secondary | ICD-10-CM | POA: Diagnosis not present

## 2017-01-23 DIAGNOSIS — N946 Dysmenorrhea, unspecified: Secondary | ICD-10-CM | POA: Diagnosis not present

## 2017-01-23 DIAGNOSIS — N921 Excessive and frequent menstruation with irregular cycle: Secondary | ICD-10-CM | POA: Diagnosis not present

## 2017-01-23 DIAGNOSIS — F17210 Nicotine dependence, cigarettes, uncomplicated: Secondary | ICD-10-CM | POA: Diagnosis not present

## 2017-01-23 DIAGNOSIS — Z79899 Other long term (current) drug therapy: Secondary | ICD-10-CM | POA: Diagnosis not present

## 2017-01-23 DIAGNOSIS — G629 Polyneuropathy, unspecified: Secondary | ICD-10-CM | POA: Diagnosis not present

## 2017-01-23 LAB — COMPREHENSIVE METABOLIC PANEL
ALT: 12 U/L — ABNORMAL LOW (ref 14–54)
AST: 17 U/L (ref 15–41)
Albumin: 3.9 g/dL (ref 3.5–5.0)
Alkaline Phosphatase: 42 U/L (ref 38–126)
Anion gap: 9 (ref 5–15)
BUN: 15 mg/dL (ref 6–20)
CO2: 23 mmol/L (ref 22–32)
Calcium: 9.1 mg/dL (ref 8.9–10.3)
Chloride: 103 mmol/L (ref 101–111)
Creatinine, Ser: 0.84 mg/dL (ref 0.44–1.00)
GFR calc Af Amer: >60 mL/min (ref >60)
GFR calc non Af Amer: >60 mL/min (ref >60)
Glucose, Bld: 80 mg/dL (ref 65–99)
Potassium: 4 mmol/L (ref 3.5–5.1)
Sodium: 135 mmol/L (ref 135–145)
Total Bilirubin: 0.6 mg/dL (ref 0.3–1.2)
Total Protein: 7 g/dL (ref 6.5–8.1)

## 2017-01-23 LAB — URINALYSIS, ROUTINE W REFLEX MICROSCOPIC
BACTERIA UA: NONE SEEN
BILIRUBIN URINE: NEGATIVE
Glucose, UA: NEGATIVE mg/dL
Hgb urine dipstick: NEGATIVE
Ketones, ur: NEGATIVE mg/dL
Nitrite: NEGATIVE
PROTEIN: NEGATIVE mg/dL
SPECIFIC GRAVITY, URINE: 1.023 (ref 1.005–1.030)
pH: 5 (ref 5.0–8.0)

## 2017-01-23 LAB — CBC
HEMATOCRIT: 39 % (ref 36.0–46.0)
HEMOGLOBIN: 12.7 g/dL (ref 12.0–15.0)
MCH: 31.2 pg (ref 26.0–34.0)
MCHC: 32.6 g/dL (ref 30.0–36.0)
MCV: 95.8 fL (ref 78.0–100.0)
Platelets: 279 10*3/uL (ref 150–400)
RBC: 4.07 MIL/uL (ref 3.87–5.11)
RDW: 12.6 % (ref 11.5–15.5)
WBC: 5.4 10*3/uL (ref 4.0–10.5)

## 2017-01-23 LAB — HCG, QUANTITATIVE, PREGNANCY: hCG, Beta Chain, Quant, S: <1 m[IU]/mL (ref <5)

## 2017-01-23 LAB — RAPID HIV SCREEN (HIV 1/2 AB+AG)
HIV 1/2 Antibodies: NONREACTIVE
HIV-1 P24 Antigen - HIV24: NONREACTIVE

## 2017-01-23 NOTE — Telephone Encounter (Signed)
Office note 01-15-17 faxed to [phone] ATTN: Pre Admission

## 2017-01-25 ENCOUNTER — Other Ambulatory Visit: Payer: Self-pay | Admitting: Obstetrics & Gynecology

## 2017-01-25 ENCOUNTER — Encounter (HOSPITAL_COMMUNITY)
Admission: RE | Disposition: A | Discharge status: Home / Self Care | Payer: Self-pay | Source: Ambulatory Visit | Attending: Obstetrics & Gynecology

## 2017-01-25 ENCOUNTER — Ambulatory Visit (HOSPITAL_COMMUNITY): Payer: BLUE CROSS/BLUE SHIELD | Admitting: Anesthesiology

## 2017-01-25 ENCOUNTER — Encounter (HOSPITAL_COMMUNITY): Payer: Self-pay | Admitting: *Deleted

## 2017-01-25 ENCOUNTER — Ambulatory Visit (HOSPITAL_COMMUNITY)
Admission: RE | Admit: 2017-01-25 | Discharge: 2017-01-25 | Disposition: A | Discharge status: Home / Self Care | Payer: BLUE CROSS/BLUE SHIELD | Source: Ambulatory Visit | Attending: Obstetrics & Gynecology | Admitting: Obstetrics & Gynecology

## 2017-01-25 DIAGNOSIS — Z79899 Other long term (current) drug therapy: Secondary | ICD-10-CM | POA: Diagnosis not present

## 2017-01-25 DIAGNOSIS — N946 Dysmenorrhea, unspecified: Secondary | ICD-10-CM | POA: Insufficient documentation

## 2017-01-25 DIAGNOSIS — N838 Other noninflammatory disorders of ovary, fallopian tube and broad ligament: Secondary | ICD-10-CM | POA: Diagnosis not present

## 2017-01-25 DIAGNOSIS — Z888 Allergy status to other drugs, medicaments and biological substances status: Secondary | ICD-10-CM | POA: Insufficient documentation

## 2017-01-25 DIAGNOSIS — Z302 Encounter for sterilization: Secondary | ICD-10-CM | POA: Diagnosis not present

## 2017-01-25 DIAGNOSIS — F909 Attention-deficit hyperactivity disorder, unspecified type: Secondary | ICD-10-CM | POA: Insufficient documentation

## 2017-01-25 DIAGNOSIS — Z8719 Personal history of other diseases of the digestive system: Secondary | ICD-10-CM | POA: Diagnosis not present

## 2017-01-25 DIAGNOSIS — Z4002 Encounter for prophylactic removal of ovary: Secondary | ICD-10-CM | POA: Diagnosis not present

## 2017-01-25 DIAGNOSIS — Z4003 Encounter for prophylactic removal of fallopian tube(s): Secondary | ICD-10-CM | POA: Diagnosis not present

## 2017-01-25 DIAGNOSIS — G629 Polyneuropathy, unspecified: Secondary | ICD-10-CM | POA: Insufficient documentation

## 2017-01-25 DIAGNOSIS — F17210 Nicotine dependence, cigarettes, uncomplicated: Secondary | ICD-10-CM | POA: Insufficient documentation

## 2017-01-25 DIAGNOSIS — N920 Excessive and frequent menstruation with regular cycle: Secondary | ICD-10-CM | POA: Diagnosis not present

## 2017-01-25 DIAGNOSIS — N921 Excessive and frequent menstruation with irregular cycle: Secondary | ICD-10-CM | POA: Diagnosis not present

## 2017-01-25 HISTORY — PX: LAPAROSCOPIC BILATERAL SALPINGECTOMY: SHX5889

## 2017-01-25 HISTORY — PX: DILITATION & CURRETTAGE/HYSTROSCOPY WITH NOVASURE ABLATION: SHX5568

## 2017-01-25 SURGERY — SALPINGECTOMY, BILATERAL, LAPAROSCOPIC
Anesthesia: General | Site: Vagina

## 2017-01-25 MED ORDER — BUPIVACAINE LIPOSOME 1.3 % IJ SUSP
INTRAMUSCULAR | Status: AC
  Filled 2017-01-25: qty 20

## 2017-01-25 MED ORDER — DIAZEPAM 10 MG PO TABS: 10.0000 mg | ORAL_TABLET | Freq: Four times a day (QID) | ORAL | 0 refills | Status: DC | PRN

## 2017-01-25 MED ORDER — SUCCINYLCHOLINE CHLORIDE 20 MG/ML IJ SOLN
INTRAMUSCULAR | Status: AC
  Filled 2017-01-25: qty 1

## 2017-01-25 MED ORDER — SUGAMMADEX SODIUM 200 MG/2ML IV SOLN
INTRAVENOUS | Status: DC | PRN
  Administered 2017-01-25: 200 mg via INTRAVENOUS

## 2017-01-25 MED ORDER — DEXAMETHASONE SODIUM PHOSPHATE 4 MG/ML IJ SOLN
4.0000 mg | Freq: Once | INTRAMUSCULAR | Status: AC
  Administered 2017-01-25: 4 mg via INTRAVENOUS

## 2017-01-25 MED ORDER — SODIUM CHLORIDE 0.9 % IJ SOLN
INTRAMUSCULAR | Status: AC
  Filled 2017-01-25: qty 10

## 2017-01-25 MED ORDER — KETOROLAC TROMETHAMINE 30 MG/ML IJ SOLN
30.0000 mg | Freq: Once | INTRAMUSCULAR | Status: AC
  Administered 2017-01-25: 30 mg via INTRAVENOUS
  Filled 2017-01-25: qty 1

## 2017-01-25 MED ORDER — FENTANYL CITRATE (PF) 100 MCG/2ML IJ SOLN
INTRAMUSCULAR | Status: DC | PRN
  Administered 2017-01-25 (×5): 50 ug via INTRAVENOUS

## 2017-01-25 MED ORDER — ONDANSETRON HCL 4 MG/2ML IJ SOLN
4.0000 mg | Freq: Once | INTRAMUSCULAR | Status: AC
  Administered 2017-01-25: 4 mg via INTRAVENOUS

## 2017-01-25 MED ORDER — SUCCINYLCHOLINE CHLORIDE 20 MG/ML IJ SOLN
INTRAMUSCULAR | Status: DC | PRN
  Administered 2017-01-25: 150 mg via INTRAVENOUS

## 2017-01-25 MED ORDER — FENTANYL CITRATE (PF) 100 MCG/2ML IJ SOLN
INTRAMUSCULAR | Status: AC
  Filled 2017-01-25: qty 2

## 2017-01-25 MED ORDER — ONDANSETRON 8 MG PO TBDP: 8.0000 mg | ORAL_TABLET | Freq: Three times a day (TID) | ORAL | 0 refills | Status: DC | PRN

## 2017-01-25 MED ORDER — SODIUM CHLORIDE 0.9 % IR SOLN
Status: DC | PRN
  Administered 2017-01-25: 3000 mL

## 2017-01-25 MED ORDER — DEXAMETHASONE SODIUM PHOSPHATE 4 MG/ML IJ SOLN
INTRAMUSCULAR | Status: AC
  Filled 2017-01-25: qty 1

## 2017-01-25 MED ORDER — OXYCODONE-ACETAMINOPHEN 10-325 MG PO TABS: 1.0000 | ORAL_TABLET | ORAL | 0 refills | Status: DC | PRN

## 2017-01-25 MED ORDER — MIDAZOLAM HCL 2 MG/2ML IJ SOLN
INTRAMUSCULAR | Status: AC
  Filled 2017-01-25: qty 2

## 2017-01-25 MED ORDER — PROPOFOL 10 MG/ML IV BOLUS
INTRAVENOUS | Status: DC | PRN
  Administered 2017-01-25: 150 mg via INTRAVENOUS

## 2017-01-25 MED ORDER — KETOROLAC TROMETHAMINE 10 MG PO TABS: 10.0000 mg | ORAL_TABLET | Freq: Three times a day (TID) | ORAL | 0 refills | Status: DC | PRN

## 2017-01-25 MED ORDER — HYDROMORPHONE HCL 1 MG/ML IJ SOLN
INTRAMUSCULAR | Status: AC
  Filled 2017-01-25: qty 1

## 2017-01-25 MED ORDER — OXYCODONE-ACETAMINOPHEN 5-325 MG PO TABS
2.0000 | ORAL_TABLET | Freq: Once | ORAL | Status: AC
  Administered 2017-01-25: 2 via ORAL

## 2017-01-25 MED ORDER — BUPIVACAINE LIPOSOME 1.3 % IJ SUSP
INTRAMUSCULAR | Status: DC | PRN
  Administered 2017-01-25: 20 mL

## 2017-01-25 MED ORDER — FENTANYL CITRATE (PF) 100 MCG/2ML IJ SOLN
25.0000 ug | INTRAMUSCULAR | Status: DC | PRN
  Administered 2017-01-25 (×2): 50 ug via INTRAVENOUS

## 2017-01-25 MED ORDER — LACTATED RINGERS IV SOLN
INTRAVENOUS | Status: DC
  Administered 2017-01-25 (×2): via INTRAVENOUS

## 2017-01-25 MED ORDER — OXYCODONE-ACETAMINOPHEN 5-325 MG PO TABS
ORAL_TABLET | ORAL | Status: AC
  Filled 2017-01-25: qty 2

## 2017-01-25 MED ORDER — ROCURONIUM BROMIDE 50 MG/5ML IV SOLN
INTRAVENOUS | Status: AC
  Filled 2017-01-25: qty 1

## 2017-01-25 MED ORDER — LIDOCAINE HCL 1 % IJ SOLN
INTRAMUSCULAR | Status: DC | PRN
  Administered 2017-01-25: 25 mg via INTRADERMAL

## 2017-01-25 MED ORDER — FENTANYL CITRATE (PF) 250 MCG/5ML IJ SOLN
INTRAMUSCULAR | Status: AC
  Filled 2017-01-25: qty 5

## 2017-01-25 MED ORDER — ONDANSETRON HCL 4 MG/2ML IJ SOLN
INTRAMUSCULAR | Status: AC
  Filled 2017-01-25: qty 2

## 2017-01-25 MED ORDER — MIDAZOLAM HCL 2 MG/2ML IJ SOLN
1.0000 mg | INTRAMUSCULAR | Status: AC
  Administered 2017-01-25 (×2): 2 mg via INTRAVENOUS

## 2017-01-25 MED ORDER — SUGAMMADEX SODIUM 200 MG/2ML IV SOLN
INTRAVENOUS | Status: AC
  Filled 2017-01-25: qty 2

## 2017-01-25 MED ORDER — HYDROMORPHONE HCL 1 MG/ML IJ SOLN
0.5000 mg | INTRAMUSCULAR | Status: AC | PRN
  Administered 2017-01-25 (×4): 0.5 mg via INTRAVENOUS
  Filled 2017-01-25: qty 1

## 2017-01-25 MED ORDER — CEFAZOLIN SODIUM-DEXTROSE 2-4 GM/100ML-% IV SOLN
2.0000 g | INTRAVENOUS | Status: AC
  Administered 2017-01-25: 2 g via INTRAVENOUS
  Filled 2017-01-25: qty 100

## 2017-01-25 MED ORDER — EPHEDRINE SULFATE 50 MG/ML IJ SOLN
INTRAMUSCULAR | Status: AC
  Filled 2017-01-25: qty 1

## 2017-01-25 MED ORDER — LIDOCAINE HCL (PF) 1 % IJ SOLN
INTRAMUSCULAR | Status: AC
  Filled 2017-01-25: qty 5

## 2017-01-25 MED ORDER — MIDAZOLAM HCL 5 MG/5ML IJ SOLN
INTRAMUSCULAR | Status: DC | PRN
  Administered 2017-01-25: 2 mg via INTRAVENOUS

## 2017-01-25 MED ORDER — ROCURONIUM BROMIDE 100 MG/10ML IV SOLN
INTRAVENOUS | Status: DC | PRN
  Administered 2017-01-25: 20 mg via INTRAVENOUS
  Administered 2017-01-25: 10 mg via INTRAVENOUS
  Administered 2017-01-25: 5 mg via INTRAVENOUS

## 2017-01-25 MED ORDER — PROPOFOL 10 MG/ML IV BOLUS
INTRAVENOUS | Status: AC
  Filled 2017-01-25: qty 40

## 2017-01-25 MED ORDER — BUPIVACAINE LIPOSOME 1.3 % IJ SUSP
20.0000 mL | Freq: Once | INTRAMUSCULAR | Status: DC
  Filled 2017-01-25: qty 20

## 2017-01-25 SURGICAL SUPPLY — 55 items
ABLATOR ENDOMETRIAL BIPOLAR (ABLATOR) ×3 IMPLANT
APPLIER CLIP 5 13 M/L LIGAMAX5 (MISCELLANEOUS)
BAG HAMPER (MISCELLANEOUS) ×3 IMPLANT
BLADE SURG SZ11 CARB STEEL (BLADE) ×3 IMPLANT
CLIP APPLIE 5 13 M/L LIGAMAX5 (MISCELLANEOUS) IMPLANT
CLOTH BEACON ORANGE TIMEOUT ST (SAFETY) ×3 IMPLANT
COVER LIGHT HANDLE STERIS (MISCELLANEOUS) ×6 IMPLANT
DRAPE PROXIMA HALF (DRAPES) ×3 IMPLANT
DRESSING OPSITE X SMALL 2X3 (GAUZE/BANDAGES/DRESSINGS) ×3 IMPLANT
ELECT REM PT RETURN 9FT ADLT (ELECTROSURGICAL) ×3
ELECTRODE REM PT RTRN 9FT ADLT (ELECTROSURGICAL) ×2 IMPLANT
FILTER SMOKE EVAC LAPAROSHD (FILTER) IMPLANT
GAUZE SPONGE 4X4 16PLY XRAY LF (GAUZE/BANDAGES/DRESSINGS) ×3 IMPLANT
GLOVE BIOGEL PI IND STRL 6.5 (GLOVE) ×2 IMPLANT
GLOVE BIOGEL PI IND STRL 7.0 (GLOVE) ×2 IMPLANT
GLOVE BIOGEL PI IND STRL 8 (GLOVE) ×2 IMPLANT
GLOVE BIOGEL PI INDICATOR 6.5 (GLOVE) ×1
GLOVE BIOGEL PI INDICATOR 7.0 (GLOVE) ×1
GLOVE BIOGEL PI INDICATOR 8 (GLOVE) ×1
GLOVE ECLIPSE 6.5 STRL STRAW (GLOVE) ×3 IMPLANT
GLOVE ECLIPSE 8.0 STRL XLNG CF (GLOVE) ×3 IMPLANT
GOWN STRL REUS W/TWL LRG LVL3 (GOWN DISPOSABLE) ×3 IMPLANT
GOWN STRL REUS W/TWL XL LVL3 (GOWN DISPOSABLE) ×3 IMPLANT
INST SET HYSTEROSCOPY (KITS) ×3 IMPLANT
INST SET LAPROSCOPIC GYN AP (KITS) ×3 IMPLANT
IV NS 1000ML (IV SOLUTION) ×1
IV NS 1000ML BAXH (IV SOLUTION) ×2 IMPLANT
IV NS IRRIG 3000ML ARTHROMATIC (IV SOLUTION) ×3 IMPLANT
KIT ROOM TURNOVER AP CYSTO (KITS) ×3 IMPLANT
MANIFOLD NEPTUNE II (INSTRUMENTS) ×3 IMPLANT
NEEDLE HYPO 21X1.5 SAFETY (NEEDLE) ×3 IMPLANT
NEEDLE INSUFFLATION 14GA 120MM (NEEDLE) ×3 IMPLANT
NS IRRIG 1000ML POUR BTL (IV SOLUTION) ×3 IMPLANT
PACK BASIC III (CUSTOM PROCEDURE TRAY) ×1
PACK PERI GYN (CUSTOM PROCEDURE TRAY) ×3 IMPLANT
PACK SRG BSC III STRL LF ECLPS (CUSTOM PROCEDURE TRAY) ×2 IMPLANT
PAD ARMBOARD 7.5X6 YLW CONV (MISCELLANEOUS) ×3 IMPLANT
PAD TELFA 3X4 1S STER (GAUZE/BANDAGES/DRESSINGS) ×3 IMPLANT
SET BASIN LINEN APH (SET/KITS/TRAYS/PACK) ×3 IMPLANT
SET IRRIG Y TYPE TUR BLADDER L (SET/KITS/TRAYS/PACK) ×3 IMPLANT
SET TUBE IRRIG SUCTION NO TIP (IRRIGATION / IRRIGATOR) IMPLANT
SHEARS HARMONIC ACE PLUS 36CM (ENDOMECHANICALS) ×3 IMPLANT
SHEET LAVH (DRAPES) ×3 IMPLANT
SLEEVE ENDOPATH XCEL 5M (ENDOMECHANICALS) ×3 IMPLANT
SOLUTION ANTI FOG 6CC (MISCELLANEOUS) ×3 IMPLANT
SPONGE GAUZE 2X2 8PLY STRL LF (GAUZE/BANDAGES/DRESSINGS) ×3 IMPLANT
STAPLER VISISTAT 35W (STAPLE) ×3 IMPLANT
SUT VICRYL 0 UR6 27IN ABS (SUTURE) ×3 IMPLANT
SYR 20CC LL (SYRINGE) ×3 IMPLANT
SYRINGE 10CC LL (SYRINGE) ×3 IMPLANT
TROCAR ENDO BLADELESS 11MM (ENDOMECHANICALS) ×3 IMPLANT
TROCAR XCEL NON-BLD 5MMX100MML (ENDOMECHANICALS) ×3 IMPLANT
TUBING INSUF HEATED (TUBING) ×3 IMPLANT
WARMER LAPAROSCOPE (MISCELLANEOUS) ×3 IMPLANT
YANKAUER SUCT BULB TIP 10FT TU (MISCELLANEOUS) ×3 IMPLANT

## 2017-01-25 NOTE — Anesthesia Procedure Notes (Signed)
Procedure Name: Intubation Date/Time: 01/25/2017 7:48 AM Performed by: Charmaine Downs, CRNA Pre-anesthesia Checklist: Patient identified, Emergency Drugs available, Suction available and Patient being monitored Patient Re-evaluated:Patient Re-evaluated prior to induction Oxygen Delivery Method: Circle system utilized Preoxygenation: Pre-oxygenation with 100% oxygen Induction Type: IV induction, Rapid sequence and Cricoid Pressure applied Ventilation: Mask ventilation without difficulty Laryngoscope Size: Mac and 4 Grade View: Grade I Tube size: 7.0 mm Number of attempts: 1 Airway Equipment and Method: Stylet Placement Confirmation: ETT inserted through vocal cords under direct vision,  positive ETCO2 and breath sounds checked- equal and bilateral Secured at: 22 cm Tube secured with: Tape Dental Injury: Teeth and Oropharynx as per pre-operative assessment  Difficulty Due To: Difficulty was anticipated and Difficult Airway-  due to neck instability Future Recommendations: Recommend- induction with short-acting agent, and alternative techniques readily available

## 2017-01-25 NOTE — Progress Notes (Signed)
Awake. Continues sitting on floor. drsg to abd x3 dry/intact. abd soft without distention. No vag drainage. Peri pad dry.

## 2017-01-25 NOTE — Interval H&P Note (Signed)
History and Physical Interval Note:  01/25/2017 7:10 AM  Lorraine Snyder  has presented today for surgery, with the diagnosis of menometrorrhagia dysmenorrhea Sterilization  The various methods of treatment have been discussed with the patient and family. After consideration of risks, benefits and other options for treatment, the patient has consented to  Procedure(s): LAPAROSCOPIC BILATERAL SALPINGECTOMY (Bilateral) DILATATION & CURETTAGE/HYSTEROSCOPY WITH NOVASURE ABLATION (N/A) as a surgical intervention .  The patient's history has been reviewed, patient examined, no change in status, stable for surgery.  I have reviewed the patient's chart and labs.  Questions were answered to the patient's satisfaction.     Florian Buff

## 2017-01-25 NOTE — Op Note (Signed)
2 surgery notes:     Preoperative Diagnosis:  1.  Multiparous female desires permanent sterilization                                          2.  Elects to have bilateral salpingectomy for ovarian cancer                                                                           prophylaxis  Postoperative Diagnosis:  Same as above  Procedure:  Laparoscopic Bilateral Salpingectomy  Surgeon:  Jacelyn Grip MD  Anaesthesia: general  Findings:  Patient had normal pelvic anatomy and no intraperitoneal abnormalities.  Description of Operation:  Patient was taken to the OR and placed into supine position where she underwent general anaesthesia.  She was placed in the dorsal lithotomy position and prepped and draped in the usual sterile fashion.  An incision was made in the umbilicus and dissection taken down to the rectus fascia which was incised and opened.  The non bladed trocar was then placed and the peritoneal cavity was insufflated.  The above noted findings were observed.  Additional trocars were placed in the right and left lower quadrants under direct visualization without difficulty.  The Harmonic scalpel was employed and salpingectomy of both the right and left tubes was performed.   The tubes were removed from the peritoneal cavity and sent to pathology.  There was good hemostasis bilaterally.  The fascia, peritoneum and subcutaneous tissue were closed using 0 vicryl.  The skin was closed using staples.  Exparel 266 mg 20 cc was injected in the 3 incision trocar sites. The patient was awakened from anaesthesia and taken to the PACU with all counts being correct x 3.  The patient received  2 gram of ancef andToradol 30 mg IV preoperatively.  Florian Buff 01/25/2017 8:37 AM  Preoperative diagnosis: Menometrorrhagia                                        Dysmenorrhea                                          Postoperative diagnoses: Same as above   Procedure: Hysteroscopy, uterine  curettage, endometrial ablation using Novasure  Surgeon: Florian Buff   Anesthesia: Laryngeal mask airway  Findings: The endometrium was normal  Description of operation: The patient was taken to the operating room and placed in the supine position. She underwent general anesthesia using the laryngeal mask airway. She was placed in the dorsal lithotomy position and prepped and draped in the usual sterile fashion. A Graves speculum was placed and the anterior cervical lip was grasped with a single-tooth tenaculum. The cervix was dilated serially to allow passage of the hysteroscope. Diagnostic hysteroscopy was performed and was found to be normal. A vigorous uterine curettage was then performed and all tissue sent to pathology for  evaluation.  I then proceeded to perform the Novasure endometrial ablation.  The cervical length was 3.0. The uterus sounded to  9.0 cm yielding a net length of 6.0 cm.  The endometrial cavity was 3 cm wide. The power was 99 watts.  The total time of therapy was 1 min 10 seconds. The array was evaluated after the procedure and tissue was adherent on all the dimensions of the surface, confirming fundal treatment as well.    All of the equipment worked well throughout the procedure.  The patient was awakened from anesthesia and taken to the recovery room in good stable condition all counts were correct. As stated above She received 2 g of Ancef and 30 mg of Toradol preoperatively. She will be discharged from the recovery room and followed up in the office in 1- 2 weeks.  Florian Buff 01/25/2017 8:38 AM

## 2017-01-25 NOTE — Progress Notes (Signed)
Attempting to get dressed. Refuses assistance. Walking around in postop room 1. "I am having a panic attack."  Husband ask if anyone could give her a valium. Informed him that Dr Elonda Husky would have to order valium. Husband states that "well he is going to do that.". Rosalyn Gess notified Dr Elonda Husky of husband request and about rx being sent to [facility] on [address] in [place]. Dr Elonda Husky will send rx's to [facility]. Order given for percocet x2 to be given po now.

## 2017-01-25 NOTE — Progress Notes (Signed)
From PACU. Awake. Crying. C/O postop abd pain. Rates pain 10. Pain med given on d/c from PACU. Reassurance given. abd soft without distention. No vag drainage. drsg to abd x3 dry/intact. Husband at side. D/C instructions reviewed. Voiced understanding.

## 2017-01-25 NOTE — Progress Notes (Signed)
Pt dressed and sitting on floor in corner of room. Crying. Reassurance given. Continues c/o postop abd pain. Percocet 5/325 mg x2 po given. Husband at side.

## 2017-01-25 NOTE — Progress Notes (Signed)
Assisted to w/c. Awake. D/C to home in good condition.

## 2017-01-25 NOTE — Transfer of Care (Signed)
Immediate Anesthesia Transfer of Care Note  Patient: Thayer Crowell  Procedure(s) Performed: LAPAROSCOPIC BILATERAL SALPINGECTOMY (Bilateral Abdomen) DILATATION & CURETTAGE/HYSTEROSCOPY WITH NOVASURE ABLATION (N/A Vagina )  Patient Location: PACU  Anesthesia Type:General  Level of Consciousness: awake and patient cooperative  Airway & Oxygen Therapy: Patient Spontanous Breathing and Patient connected to face mask oxygen  Post-op Assessment: Report given to RN, Post -op Vital signs reviewed and stable and Patient moving all extremities  Post vital signs: Reviewed and stable  Last Vitals:  Vitals:   01/25/17 0715 01/25/17 0730  BP: (!) 123/51 107/65  Resp: (!) 27 (!) 34  Temp:    SpO2: 99% 100%    Last Pain:  Vitals:   01/25/17 0632  TempSrc: Oral      Patients Stated Pain Goal: 8 (57/50/51 8335)  Complications: No apparent anesthesia complications

## 2017-01-25 NOTE — Anesthesia Preprocedure Evaluation (Signed)
Anesthesia Evaluation  Patient identified by MRN, date of birth, ID band Patient awake    Reviewed: Allergy & Precautions, NPO status , Patient's Chart, lab work & pertinent test results  Airway Mallampati: II  TM Distance: >3 FB Neck ROM: Limited    Dental  (+) Teeth Intact   Pulmonary Current Smoker,    breath sounds clear to auscultation       Cardiovascular negative cardio ROS   Rhythm:Regular Rate:Normal     Neuro/Psych PSYCHIATRIC DISORDERS (ADHD)  Neuromuscular disease    GI/Hepatic negative GI ROS, Neg liver ROS,   Endo/Other  negative endocrine ROS  Renal/GU negative Renal ROS     Musculoskeletal   Abdominal   Peds  Hematology negative hematology ROS (+)   Anesthesia Other Findings   Reproductive/Obstetrics                             Anesthesia Physical Anesthesia Plan  ASA: II  Anesthesia Plan: General   Post-op Pain Management:    Induction: Intravenous, Rapid sequence and Cricoid pressure planned  PONV Risk Score and Plan:   Airway Management Planned: Oral ETT  Additional Equipment:   Intra-op Plan:   Post-operative Plan: Extubation in OR  Informed Consent: I have reviewed the patients History and Physical, chart, labs and discussed the procedure including the risks, benefits and alternatives for the proposed anesthesia with the patient or authorized representative who has indicated his/her understanding and acceptance.     Plan Discussed with:   Anesthesia Plan Comments:         Anesthesia Quick Evaluation

## 2017-01-25 NOTE — H&P (Signed)
Preoperative History and Physical  Lorraine Snyder is a 31 y.o. G1P0010 with Patient's last menstrual period was 01/01/2017. admitted for a laparoscopic bilateral salpingectomy for sterilization and hysteroscopy uterine curettage endometrial ablation Sonogram is normal, responded pretty well to the megestrol.   31 [id number] Patient's last menstrual period was 12/01/2016. Lorraine Haysis in today to discuss increasing problems with her menses The patient states now for the past several months her menstrual periods have been debilitating Location:Pain is midline. Quality:Pain is stabbing sharp and the bleeding is heavy with clots. Severity:The bleeding is severe and the pain is severe. Timing:Monthly. Duration:5 days. Context:No pain except with her menses.    PMH:    Past Medical History:  Diagnosis Date  . History of stomach ulcers     PSH:          Past Surgical History:  Procedure Laterality Date  . KNEE ARTHROSCOPY      POb/GynH:              OB History    Gravida Para Term Preterm AB Living   1       1 0   SAB TAB Ectopic Multiple Live Births     1            SH:   Social History        Tobacco Use  . Smoking status: Current Every Day Smoker    Types: E-cigarettes  . Smokeless tobacco: Never Used  Substance Use Topics  . Alcohol use: Yes    Alcohol/week: 3.6 oz    Types: 6 Cans of beer per week  . Drug use: No    FH:         Family History  Problem Relation Age of Onset  . Arthritis Father   . Diabetes Father   . Diabetes Sister   . Cancer Sister   . Diabetes Paternal Grandmother   . Heart attack Paternal Grandfather   . Cancer Maternal Grandmother        ovarian  . Alzheimer's disease Maternal Grandfather   . Hypertension Mother      Allergies:      Allergies  Allergen Reactions  . Benadryl [Diphenhydramine] Rash    Medications:       Current Outpatient Medications:  .  gabapentin  (NEURONTIN) 400 MG capsule, TAKE 1 CAPSULE (400 MG TOTAL) BY MOUTH 3 (THREE) TIMES DAILY., Disp: 90 capsule, Rfl: 2 .  lisdexamfetamine (VYVANSE) 60 MG capsule, Take 1 capsule (60 mg total) by mouth every morning., Disp: 30 capsule, Rfl: 0 .  megestrol (MEGACE) 40 MG tablet, 3 tablets a day for 5 days, 2 tablets a day for 5 days then 1 tablet daily, Disp: 45 tablet, Rfl: 3 .  naproxen (NAPROSYN) 500 MG tablet, TAKE 1 TABLET (500 MG TOTAL) BY MOUTH 2 (TWO) TIMES DAILY WITH A MEAL., Disp: 60 tablet, Rfl: 0 .  ketorolac (TORADOL) 10 MG tablet, Take 1 tablet (10 mg total) by mouth every 8 (eight) hours as needed. (Patient not taking: Reported on 01/07/2017), Disp: 15 tablet, Rfl: 0 .  valACYclovir (VALTREX) 1000 MG tablet, Take 2 tablets (2,000 mg total) by mouth 2 (two) times daily. (Patient not taking: Reported on 12/04/2016), Disp: 4 tablet, Rfl: 1  Review of Systems:   Review of Systems  Constitutional: Negative for fever, chills, weight loss, malaise/fatigue and diaphoresis.  HENT: Negative for hearing loss, ear pain, nosebleeds, congestion, sore throat, neck pain, tinnitus and ear discharge.   Eyes:  Negative for blurred vision, double vision, photophobia, pain, discharge and redness.  Respiratory: Negative for cough, hemoptysis, sputum production, shortness of breath, wheezing and stridor.   Cardiovascular: Negative for chest pain, palpitations, orthopnea, claudication, leg swelling and PND.  Gastrointestinal: Positive for abdominal pain. Negative for heartburn, nausea, vomiting, diarrhea, constipation, blood in stool and melena.  Genitourinary: Negative for dysuria, urgency, frequency, hematuria and flank pain.  Musculoskeletal: Negative for myalgias, back pain, joint pain and falls.  Skin: Negative for itching and rash.  Neurological: Negative for dizziness, tingling, tremors, sensory change, speech change, focal weakness, seizures, loss of consciousness, weakness and headaches.   Endo/Heme/Allergies: Negative for environmental allergies and polydipsia. Does not bruise/bleed easily.  Psychiatric/Behavioral: Negative for depression, suicidal ideas, hallucinations, memory loss and substance abuse. The patient is not nervous/anxious and does not have insomnia.      PHYSICAL EXAM:  Blood pressure 118/76, pulse 70, height 5\' 8"  (1.727 m), weight 148 lb (67.1 kg), last menstrual period 01/01/2017.    Vitals reviewed. Constitutional: She is oriented to person, place, and time. She appears well-developed and well-nourished.  HENT:  Head: Normocephalic and atraumatic.  Right Ear: External ear normal.  Left Ear: External ear normal.  Nose: Nose normal.  Mouth/Throat: Oropharynx is clear and moist.  Eyes: Conjunctivae and EOM are normal. Pupils are equal, round, and reactive to light. Right eye exhibits no discharge. Left eye exhibits no discharge. No scleral icterus.  Neck: Normal range of motion. Neck supple. No tracheal deviation present. No thyromegaly present.  Cardiovascular: Normal rate, regular rhythm, normal heart sounds and intact distal pulses.  Exam reveals no gallop and no friction rub.   No murmur heard. Respiratory: Effort normal and breath sounds normal. No respiratory distress. She has no wheezes. She has no rales. She exhibits no tenderness.  GI: Soft. Bowel sounds are normal. She exhibits no distension and no mass. There is tenderness. There is no rebound and no guarding.  Genitourinary:       Vulva is normal without lesions Vagina is pink moist without discharge Cervix normal in appearance and pap is normal Uterus is normal size, contour, position, consistency, mobility, non-tender Adnexa is negative with normal sized ovaries by sonogram  Musculoskeletal: Normal range of motion. She exhibits no edema and no tenderness.  Neurological: She is alert and oriented to person, place, and time. She has normal reflexes. She displays normal reflexes. No  cranial nerve deficit. She exhibits normal muscle tone. Coordination normal.  Skin: Skin is warm and dry. No rash noted. No erythema. No pallor.  Psychiatric: [name]. Her behavior is normal. Judgment and thought content normal.    Labs: Results for orders placed or performed during the hospital encounter of 01/23/17 (from the past 168 hour(s))  CBC   Collection Time: 01/23/17 10:36 AM  Result Value Ref Range   WBC 5.4 4.0 - 10.5 K/uL   RBC 4.07 3.87 - 5.11 MIL/uL   Hemoglobin 12.7 12.0 - 15.0 g/dL   HCT 39.0 36.0 - 46.0 %   MCV 95.8 78.0 - 100.0 fL   MCH 31.2 26.0 - 34.0 pg   MCHC 32.6 30.0 - 36.0 g/dL   RDW 12.6 11.5 - 15.5 %   Platelets 279 150 - 400 K/uL  Comprehensive metabolic panel   Collection Time: 01/23/17 10:36 AM  Result Value Ref Range   Sodium 135 135 - 145 mmol/L   Potassium 4.0 3.5 - 5.1 mmol/L   Chloride 103 101 -  111 mmol/L   CO2 23 22 - 32 mmol/L   Glucose, Bld 80 65 - 99 mg/dL   BUN 15 6 - 20 mg/dL   Creatinine, Ser 0.84 0.44 - 1.00 mg/dL   Calcium 9.1 8.9 - 10.3 mg/dL   Total Protein 7.0 6.5 - 8.1 g/dL   Albumin 3.9 3.5 - 5.0 g/dL   AST 17 15 - 41 U/L   ALT 12 (L) 14 - 54 U/L   Alkaline Phosphatase 42 38 - 126 U/L   Total Bilirubin 0.6 0.3 - 1.2 mg/dL   GFR calc non Af Amer >60 >60 mL/min   GFR calc Af Amer >60 >60 mL/min   Anion gap 9 5 - 15  hCG, quantitative, pregnancy   Collection Time: 01/23/17 10:36 AM  Result Value Ref Range   hCG, Beta Chain, Quant, S <1 <5 mIU/mL  Rapid HIV screen (HIV 1/2 Ab+Ag)   Collection Time: 01/23/17 10:36 AM  Result Value Ref Range   HIV-1 P24 Antigen - HIV24 NON REACTIVE NON REACTIVE   HIV 1/2 Antibodies NON REACTIVE NON REACTIVE   Interpretation (HIV Ag Ab)      A non reactive test result means that HIV 1 or HIV 2 antibodies and HIV 1 p24 antigen were not detected in the specimen.  Urinalysis, Routine w reflex microscopic   Collection Time: 01/23/17 10:36 AM  Result Value Ref Range    Color, Urine YELLOW YELLOW   APPearance CLEAR CLEAR   Specific Gravity, Urine 1.023 1.005 - 1.030   pH 5.0 5.0 - 8.0   Glucose, UA NEGATIVE NEGATIVE mg/dL   Hgb urine dipstick NEGATIVE NEGATIVE   Bilirubin Urine NEGATIVE NEGATIVE   Ketones, ur NEGATIVE NEGATIVE mg/dL   Protein, ur NEGATIVE NEGATIVE mg/dL   Nitrite NEGATIVE NEGATIVE   Leukocytes, UA TRACE (A) NEGATIVE   RBC / HPF 0-5 0 - 5 RBC/hpf   WBC, UA 0-5 0 - 5 WBC/hpf   Bacteria, UA NONE SEEN NONE SEEN   Squamous Epithelial / LPF 0-5 (A) NONE SEEN   Mucus PRESENT      EKG: No orders found for this or any previous visit.  Imaging Studies:  Imaging Results  US Pelvis Transvanginal Non-ob (tv Only)  Result Date: 01/07/2017 GYNECOLOGIC SONOGRAM Lorraine Snyder is a 31 y.o. G1P0010 LMP 01/01/2017,she is here for a pelvic sonogram for menometrorrhagia,dysmenorrhea. Uterus                      6.9 x 4 x 5.3 cm, Vol 76 ml, homogeneous anteverted uterus,wnl Endometrium          6.1 mm, symmetrical, wnl Right ovary             2.8 x 1.3 x 2.2 cm, wnl Left ovary                3.3 x 1.9 x 2.1 cm, wnl No free fluid Technician Comments: PELVIC US TA/TV: homogeneous anteverted uterus,wnl,EEC 6.1 mm,normal ovaries bilat,ovaries appear mobile,no free fluid,no pain during ultrasound [employer] 01/07/2017 4:31 PM Clinical Impression and recommendations: I have reviewed the sonogram results above, combined with the patient's current clinical course, below are my impressions and any appropriate recommendations for management based on the sonographic findings. Uterus normal, no pathology Endometrium is normal thin on megestrol Both ovaries are normal Florian Buff 01/07/2017 5:07 PM   US Pelvis Transvanginal Non-ob (tv Only)  Result Date: 01/07/2017 GYNECOLOGIC SONOGRAM Talise Mankins is a 31  y.o. G1P0010 LMP 01/01/2017,she is here for a pelvic sonogram for menometrorrhagia,dysmenorrhea. Uterus                      6.9 x 4 x 5.3 cm, Vol 76 ml,  homogeneous anteverted uterus,wnl Endometrium          6.1 mm, symmetrical, wnl Right ovary             2.8 x 1.3 x 2.2 cm, wnl Left ovary                3.3 x 1.9 x 2.1 cm, wnl No free fluid Technician Comments: PELVIC US TA/TV: homogeneous anteverted uterus,wnl,EEC 6.1 mm,normal ovaries bilat,ovaries appear mobile,no free fluid,no pain during ultrasound [employer] 01/07/2017 4:31 PM Clinical Impression and recommendations: I have reviewed the sonogram results above, combined with the patient's current clinical course, below are my impressions and any appropriate recommendations for management based on the sonographic findings. Uterus normal, no pathology Endometrium is normal thin on megestrol Both ovaries are normal Florian Buff 01/07/2017 5:07 PM       Assessment: Menometrorrhagia Dysmenorrhea Desires permanent sterilization      Patient Active Problem List   Diagnosis Date Noted  . Current smoker 08/07/2016  . Amenorrhea 08/07/2016  . Peripheral neuropathy 08/07/2016  . Attention deficit hyperactivity disorder (ADHD) 12/29/2015    Plan: Laparoscopic bilateral salpingectomy for sterilization, hysteroscopy uterine curettage endometrial ablation for management of desire for sterilization and for menorrhagia/dysmenorrhea 01/25/2017@0730   Florian Buff 01/07/2017 5:24 PM

## 2017-01-25 NOTE — Progress Notes (Signed)
rx sent [facility] [place]. Husband wants [facility] [address] in [place]. [facility] [place] [facility]. [facility] to [facility] [address] in [place]. Husband upset this is unable to be done.

## 2017-01-25 NOTE — Discharge Instructions (Signed)
Endometrial Ablation Endometrial ablation is a procedure that destroys the thin inner layer of the lining of the uterus (endometrium). This procedure may be done:  To stop heavy periods.  To stop bleeding that is causing anemia.  To control irregular bleeding.  To treat bleeding caused by small tumors (fibroids) in the endometrium.  This procedure is often an alternative to major surgery, such as removal of the uterus and cervix (hysterectomy). As a result of this procedure:  You may not be able to have children. However, if you are premenopausal (you have not gone through menopause): ? You may still have a small chance of getting pregnant. ? You will need to use a reliable method of birth control after the procedure to prevent pregnancy.  You may stop having a menstrual period, or you may have only a small amount of bleeding during your period. Menstruation may return several years after the procedure.  Tell a health care provider about:  Any allergies you have.  All medicines you are taking, including vitamins, herbs, eye drops, creams, and over-the-counter medicines.  Any problems you or family members have had with the use of anesthetic medicines.  Any blood disorders you have.  Any surgeries you have had.  Any medical conditions you have. What are the risks? Generally, this is a safe procedure. However, problems may occur, including:  A hole (perforation) in the uterus or bowel.  Infection of the uterus, bladder, or vagina.  Bleeding.  Damage to other structures or organs.  An air bubble in the lung (air embolus).  Problems with pregnancy after the procedure.  Failure of the procedure.  Decreased ability to diagnose cancer in the endometrium.  What happens before the procedure?  You will have tests of your endometrium to make sure there are no pre-cancerous cells or cancer cells present.  You may have an ultrasound of the uterus.  You may be given  medicines to thin the endometrium.  Ask your health care provider about: ? Changing or stopping your regular medicines. This is especially important if you take diabetes medicines or blood thinners. ? Taking medicines such as aspirin and ibuprofen. These medicines can thin your blood. Do not take these medicines before your procedure if your doctor tells you not to.  Plan to have someone take you home from the hospital or clinic. What happens during the procedure?  You will lie on an exam table with your feet and legs supported as in a pelvic exam.  To lower your risk of infection: ? Your health care team will wash or sanitize their hands and put on germ-free (sterile) gloves. ? Your genital area will be washed with soap.  An IV tube will be inserted into one of your veins.  You will be given a medicine to help you relax (sedative).  A surgical instrument with a light and camera (resectoscope) will be inserted into your vagina and moved into your uterus. This allows your surgeon to see inside your uterus.  Endometrial tissue will be removed using one of the following methods: ? Radiofrequency. This method uses a radiofrequency-alternating electric current to remove the endometrium. ? Cryotherapy. This method uses extreme cold to freeze the endometrium. ? Heated-free liquid. This method uses a heated saltwater (saline) solution to remove the endometrium. ? Microwave. This method uses high-energy microwaves to heat up the endometrium and remove it. ? Thermal balloon. This method involves inserting a catheter with a balloon tip into the uterus. The balloon tip is  filled with heated fluid to remove the endometrium. The procedure may vary among health care providers and hospitals. What happens after the procedure?  Your blood pressure, heart rate, breathing rate, and blood oxygen level will be monitored until the medicines you were given have worn off.  As tissue healing occurs, you may  notice vaginal bleeding for 4-6 weeks after the procedure. You may also experience: ? Cramps. ? Thin, watery vaginal discharge that is light pink or brown in color. ? A need to urinate more frequently than usual. ? Nausea.  Do not drive for 24 hours if you were given a sedative.  Do not have sex or insert anything into your vagina until your health care provider approves. Summary  Endometrial ablation is done to treat the many causes of heavy menstrual bleeding.  The procedure may be done only after medications have been tried to control the bleeding.  Plan to have someone take you home from the hospital or clinic. This information is not intended to replace advice given to you by your health care provider. Make sure you discuss any questions you have with your health care provider. Document Released: 12/09/2003 Document Revised: 02/16/2016 Document Reviewed: 02/16/2016 Elsevier Interactive Patient Education  2017 [employer]. Laparoscopic Tubal Ligation, Care After Refer to this sheet in the next few weeks. These instructions provide you with information about caring for yourself after your procedure. Your health care provider may also give you more specific instructions. Your treatment has been planned according to current medical practices, but problems sometimes occur. Call your health care provider if you have any problems or questions after your procedure. What can I expect after the procedure? After the procedure, it is common to have:  A sore throat.  Discomfort in your shoulder.  Mild discomfort or cramping in your abdomen.  Gas pains.  Pain or soreness in the area where the surgical cut (incision) was made.  A bloated feeling.  Tiredness.  Nausea.  Vomiting.  Follow these instructions at home: Medicines  Take over-the-counter and prescription medicines only as told by your health care provider.  Do not take aspirin because it can cause bleeding.  Do not  drive or operate heavy machinery while taking prescription pain medicine. Activity  Rest for the rest of the day.  Return to your normal activities as told by your health care provider. Ask your health care provider what activities are safe for you. Incision care   Follow instructions from your health care provider about how to take care of your incision. Make sure you: ? Wash your hands with soap and water before you change your bandage (dressing). If soap and water are not available, use hand sanitizer. ? Change your dressing as told by your health care provider. ? Leave stitches (sutures) in place. They may need to stay in place for 2 weeks or longer.  Check your incision area every day for signs of infection. Check for: ? More redness, swelling, or pain. ? More fluid or blood. ? Warmth. ? Pus or a bad smell. Other Instructions  Do not take baths, swim, or use a hot tub until your health care provider approves. You may take showers.  Keep all follow-up visits as told by your health care provider. This is important.  Have someone help you with your daily household tasks for the first few days. Contact a health care provider if:  You have more redness, swelling, or pain around your incision.  Your incision feels warm  to the touch.  You have pus or a bad smell coming from your incision.  The edges of your incision break open after the sutures have been removed.  Your pain does not improve after 2-3 days.  You have a rash.  You repeatedly become dizzy or light-headed.  Your pain medicine is not helping.  You are constipated. Get help right away if:  You have a fever.  You faint.  You have increasing pain in your abdomen.  You have severe pain in one or both of your shoulders.  You have fluid or blood coming from your sutures or from your vagina.  You have shortness of breath or difficulty breathing.  You have chest pain or leg pain.  You have ongoing  nausea, vomiting, or diarrhea. This information is not intended to replace advice given to you by your health care provider. Make sure you discuss any questions you have with your health care provider. Document Released: 08/18/2004 Document Revised: 07/04/2015 Document Reviewed: 01/09/2015 Elsevier Interactive Patient Education  [employer].

## 2017-01-25 NOTE — Anesthesia Postprocedure Evaluation (Signed)
Anesthesia Post Note  Patient: Lorraine Snyder  Procedure(s) Performed: LAPAROSCOPIC BILATERAL SALPINGECTOMY (Bilateral Abdomen) DILATATION & CURETTAGE/HYSTEROSCOPY WITH NOVASURE ABLATION (N/A Vagina )  Patient location during evaluation: PACU Anesthesia Type: General Level of consciousness: awake and alert and patient cooperative Pain management: pain level controlled Vital Signs Assessment: post-procedure vital signs reviewed and stable Respiratory status: spontaneous breathing, nonlabored ventilation and respiratory function stable Cardiovascular status: blood pressure returned to baseline Postop Assessment: no apparent nausea or vomiting Anesthetic complications: no     Last Vitals:  Vitals:   01/25/17 0715 01/25/17 0730  BP: (!) 123/51 107/65  Resp: (!) 27 (!) 34  Temp:    SpO2: 99% 100%    Last Pain:  Vitals:   01/25/17 4540  TempSrc: Oral                 Lorraine Snyder

## 2017-01-28 ENCOUNTER — Encounter: Payer: Self-pay | Admitting: Obstetrics & Gynecology

## 2017-01-28 ENCOUNTER — Encounter (HOSPITAL_COMMUNITY): Payer: Self-pay | Admitting: Obstetrics & Gynecology

## 2017-01-28 ENCOUNTER — Telehealth: Payer: Self-pay | Admitting: Obstetrics & Gynecology

## 2017-01-28 ENCOUNTER — Telehealth: Payer: Self-pay | Admitting: *Deleted

## 2017-01-28 ENCOUNTER — Encounter: Payer: Self-pay | Admitting: *Deleted

## 2017-01-28 MED ORDER — HYDROMORPHONE HCL 2 MG PO TABS: 2.0000 mg | ORAL_TABLET | ORAL | 0 refills | Status: DC | PRN

## 2017-01-28 NOTE — Telephone Encounter (Signed)
Patient called very tearful and upset that she is down to 2 pain pills and can't take Ketorolac since she is having surgery Thursday for her neck. States she cannot have anymore Percocet for 30 days.  Her abdomen is very sore and can't take it anymore. Please advise.

## 2017-01-28 NOTE — Telephone Encounter (Signed)
I e prescribed #15 Dilaudid 2 mg whci will carry her through til her neck surgery is done, that is all I will be able to do

## 2017-01-31 ENCOUNTER — Encounter: Payer: BLUE CROSS/BLUE SHIELD | Admitting: Obstetrics & Gynecology

## 2017-01-31 DIAGNOSIS — S14105A Unspecified injury at C5 level of cervical spinal cord, initial encounter: Secondary | ICD-10-CM | POA: Diagnosis not present

## 2017-01-31 DIAGNOSIS — M4722 Other spondylosis with radiculopathy, cervical region: Secondary | ICD-10-CM | POA: Diagnosis not present

## 2017-01-31 DIAGNOSIS — F419 Anxiety disorder, unspecified: Secondary | ICD-10-CM | POA: Diagnosis not present

## 2017-01-31 DIAGNOSIS — M50123 Cervical disc disorder at C6-C7 level with radiculopathy: Secondary | ICD-10-CM | POA: Diagnosis not present

## 2017-01-31 DIAGNOSIS — J449 Chronic obstructive pulmonary disease, unspecified: Secondary | ICD-10-CM | POA: Diagnosis not present

## 2017-01-31 DIAGNOSIS — M47892 Other spondylosis, cervical region: Secondary | ICD-10-CM | POA: Diagnosis not present

## 2017-01-31 DIAGNOSIS — M5013 Cervical disc disorder with radiculopathy, cervicothoracic region: Secondary | ICD-10-CM | POA: Diagnosis not present

## 2017-01-31 DIAGNOSIS — M50122 Cervical disc disorder at C5-C6 level with radiculopathy: Secondary | ICD-10-CM | POA: Diagnosis not present

## 2017-01-31 DIAGNOSIS — M4322 Fusion of spine, cervical region: Secondary | ICD-10-CM | POA: Diagnosis not present

## 2017-01-31 DIAGNOSIS — M4802 Spinal stenosis, cervical region: Secondary | ICD-10-CM | POA: Diagnosis not present

## 2017-02-02 DIAGNOSIS — M4322 Fusion of spine, cervical region: Secondary | ICD-10-CM | POA: Diagnosis not present

## 2017-02-06 NOTE — Telephone Encounter (Signed)
Meds ordered this encounter  Medications  . HYDROmorphone (DILAUDID) 2 MG tablet    Sig: Take 1 tablet (2 mg total) by mouth every 4 (four) hours as needed for severe pain.    Dispense:  15 tablet    Refill:  0

## 2017-02-14 ENCOUNTER — Encounter: Payer: BLUE CROSS/BLUE SHIELD | Admitting: Obstetrics & Gynecology

## 2017-02-19 ENCOUNTER — Ambulatory Visit (INDEPENDENT_AMBULATORY_CARE_PROVIDER_SITE_OTHER): Payer: BLUE CROSS/BLUE SHIELD | Admitting: Family

## 2017-02-19 ENCOUNTER — Encounter: Payer: Self-pay | Admitting: Family

## 2017-02-19 VITALS — BP 100/66 | HR 70 | Temp 96.5°F | Ht 68.0 in | Wt 154.6 lb

## 2017-02-19 DIAGNOSIS — F902 Attention-deficit hyperactivity disorder, combined type: Secondary | ICD-10-CM | POA: Diagnosis not present

## 2017-02-19 DIAGNOSIS — M542 Cervicalgia: Secondary | ICD-10-CM | POA: Diagnosis not present

## 2017-02-19 DIAGNOSIS — F17200 Nicotine dependence, unspecified, uncomplicated: Secondary | ICD-10-CM | POA: Diagnosis not present

## 2017-02-19 MED ORDER — LISDEXAMFETAMINE DIMESYLATE 60 MG PO CAPS: 60.0000 mg | ORAL_CAPSULE | ORAL | 0 refills | Status: DC

## 2017-02-19 MED ORDER — BACLOFEN 20 MG PO TABS: 20.0000 mg | ORAL_TABLET | Freq: Three times a day (TID) | ORAL | 0 refills | Status: DC

## 2017-02-19 NOTE — Progress Notes (Signed)
   Subjective:    Patient ID: Lorraine Snyder, female    DOB: 05/05/1985, 32 y.o.   MRN: [id number]  HPI Pt presents to the office today for ADHD follow up. PT is currently taking Vyvanse 60 mg daily. States this is helps greatly with her staying on task. Denies any weight loss or adverse effects.  Pt had laparoscopic bilateral salpingectomy and hysteroscopy on 01/25/17 and then had a neck fusion on 01/31/17. Pt states she is in significant amount of pain. Pt has been out of work and "trying" to take it easy.    Review of Systems  Musculoskeletal: Positive for arthralgias.       Neck pain  All other systems reviewed and are negative.      Objective:   Physical Exam  Constitutional: She is oriented to person, place, and time. She appears well-developed and well-nourished. No distress.  HENT:  Head: Normocephalic and atraumatic.  Left Ear: External ear normal.  Eyes: Pupils are equal, round, and reactive to light.  Neck: No thyromegaly present.  Pain posterior neck with flexion or rotation   Cardiovascular: Normal rate, regular rhythm, normal heart sounds and intact distal pulses.  No murmur heard. Pulmonary/Chest: Effort normal and breath sounds normal. No respiratory distress. She has no wheezes.  Abdominal: Soft. Bowel sounds are normal. She exhibits no distension. There is no tenderness.  Musculoskeletal: Normal range of motion. She exhibits no edema or tenderness.  Neurological: She is alert and oriented to person, place, and time.  Skin: Skin is warm and dry.  Psychiatric: [name]. Her behavior is normal. Judgment and thought content normal.  Vitals reviewed.    BP 100/66   Pulse 70   Temp (!) 96.5 F (35.8 C)   Ht 5\' 8"  (1.727 m)   Wt 154 lb 9.6 oz (70.1 kg)   LMP 01/23/2017   BMI 23.51 kg/m      Assessment & Plan:  1. Attention deficit hyperactivity disorder (ADHD), combined type Meds as prescribed Behavior modification as needed Follow-up  for recheck in 3 months - lisdexamfetamine (VYVANSE) 60 MG capsule; Take 1 capsule (60 mg total) by mouth every morning.  Dispense: 30 capsule; Refill: 0 - lisdexamfetamine (VYVANSE) 60 MG capsule; Take 1 capsule (60 mg total) by mouth every morning.  Dispense: 30 capsule; Refill: 0 - lisdexamfetamine (VYVANSE) 60 MG capsule; Take 1 capsule (60 mg total) by mouth every morning.  Dispense: 30 capsule; Refill: 0  2. Current smoker  3. Neck pain Keep follow up with surgeon  - baclofen (LIORESAL) 20 MG tablet; Take 1 tablet (20 mg total) by mouth 3 (three) times daily.  Dispense: 60 each; Refill: 0   Evelina Dun, FNP

## 2017-02-19 NOTE — Patient Instructions (Signed)

## 2017-02-25 ENCOUNTER — Encounter: Payer: BLUE CROSS/BLUE SHIELD | Admitting: Obstetrics & Gynecology

## 2017-02-28 ENCOUNTER — Other Ambulatory Visit: Payer: Self-pay

## 2017-02-28 ENCOUNTER — Ambulatory Visit (INDEPENDENT_AMBULATORY_CARE_PROVIDER_SITE_OTHER): Payer: BLUE CROSS/BLUE SHIELD | Admitting: Obstetrics & Gynecology

## 2017-02-28 ENCOUNTER — Encounter: Payer: Self-pay | Admitting: Obstetrics & Gynecology

## 2017-02-28 VITALS — BP 120/78 | HR 89 | Ht 68.0 in | Wt 155.0 lb

## 2017-02-28 DIAGNOSIS — Z9889 Other specified postprocedural states: Secondary | ICD-10-CM

## 2017-02-28 NOTE — Progress Notes (Signed)
  HPI: Patient returns for routine postoperative follow-up having undergone hystersocopy D&C NovaSure endometrial ablation, laparoscopic bilateral salpingectomy on 01/25/2018.  The patient's immediate postoperative recovery has been unremarkable. Since hospital discharge the patient reports no bleeding issues.   Current Outpatient Medications: gabapentin (NEURONTIN) 600 MG tablet, Take 600-1,200 mg by mouth 3 (three) times daily., Disp: , Rfl:  lisdexamfetamine (VYVANSE) 60 MG capsule, Take 1 capsule (60 mg total) by mouth every morning., Disp: 30 capsule, Rfl: 0 oxyCODONE-acetaminophen (PERCOCET) 10-325 MG tablet, Take 1 tablet by mouth every 4 (four) hours as needed for pain., Disp: 15 tablet, Rfl: 0 baclofen (LIORESAL) 20 MG tablet, Take 1 tablet (20 mg total) by mouth 3 (three) times daily. (Patient not taking: Reported on 02/28/2017), Disp: 60 each, Rfl: 0 ibuprofen (ADVIL,MOTRIN) 800 MG tablet, Take 800 mg by mouth every 8 (eight) hours as needed for mild pain or moderate pain., Disp: , Rfl:  lisdexamfetamine (VYVANSE) 60 MG capsule, Take 1 capsule (60 mg total) by mouth every morning. (Patient not taking: Reported on 02/28/2017), Disp: 30 capsule, Rfl: 0 lisdexamfetamine (VYVANSE) 60 MG capsule, Take 1 capsule (60 mg total) by mouth every morning. (Patient not taking: Reported on 02/28/2017), Disp: 30 capsule, Rfl: 0 oxyCODONE-acetaminophen (PERCOCET) 10-325 MG tablet, Take 1 tablet by mouth every 4 (four) hours as needed for pain. (Patient not taking: Reported on 02/28/2017), Disp: 30 tablet, Rfl: 0  No current facility-administered medications for this visit.     Blood pressure 120/78, pulse 89, height 5\' 8"  (1.727 m), weight 155 lb (70.3 kg).  Physical Exam: incsion x 3 are all good Uterus is normal  Diagnostic Tests:   Pathology: benign  Impression: Postoperative state, hysteroscopy D&C ablation, salpingectomy  Plan:   Follow up: 1  years  Florian Buff, MD

## 2017-03-01 DIAGNOSIS — M4722 Other spondylosis with radiculopathy, cervical region: Secondary | ICD-10-CM | POA: Diagnosis not present

## 2017-03-27 ENCOUNTER — Telehealth: Payer: Self-pay | Admitting: Family

## 2017-03-27 NOTE — Telephone Encounter (Signed)
Pt informed to download coupon on [email or web address] Pt verbalizes understanding

## 2017-04-01 DIAGNOSIS — M50123 Cervical disc disorder at C6-C7 level with radiculopathy: Secondary | ICD-10-CM | POA: Diagnosis not present

## 2017-04-01 DIAGNOSIS — M5013 Cervical disc disorder with radiculopathy, cervicothoracic region: Secondary | ICD-10-CM | POA: Diagnosis not present

## 2017-04-09 ENCOUNTER — Encounter: Payer: Self-pay | Admitting: Family

## 2017-04-23 ENCOUNTER — Encounter: Payer: Self-pay | Admitting: Family

## 2017-04-23 DIAGNOSIS — M542 Cervicalgia: Principal | ICD-10-CM

## 2017-04-23 DIAGNOSIS — G8929 Other chronic pain: Secondary | ICD-10-CM

## 2017-05-01 ENCOUNTER — Encounter: Payer: Self-pay | Admitting: Family

## 2017-05-02 ENCOUNTER — Other Ambulatory Visit: Payer: Self-pay | Admitting: Family

## 2017-05-02 DIAGNOSIS — M542 Cervicalgia: Principal | ICD-10-CM

## 2017-05-02 DIAGNOSIS — G8929 Other chronic pain: Secondary | ICD-10-CM

## 2017-05-21 ENCOUNTER — Ambulatory Visit: Payer: BLUE CROSS/BLUE SHIELD | Admitting: Family

## 2017-06-17 ENCOUNTER — Ambulatory Visit: Payer: Self-pay | Admitting: Family

## 2017-06-17 ENCOUNTER — Encounter: Payer: Self-pay | Admitting: Family

## 2017-06-17 ENCOUNTER — Ambulatory Visit (INDEPENDENT_AMBULATORY_CARE_PROVIDER_SITE_OTHER): Payer: BLUE CROSS/BLUE SHIELD | Admitting: Family

## 2017-06-17 VITALS — BP 132/87 | HR 83 | Temp 98.9°F | Ht 68.0 in | Wt 151.8 lb

## 2017-06-17 DIAGNOSIS — L259 Unspecified contact dermatitis, unspecified cause: Secondary | ICD-10-CM

## 2017-06-17 DIAGNOSIS — F902 Attention-deficit hyperactivity disorder, combined type: Secondary | ICD-10-CM | POA: Diagnosis not present

## 2017-06-17 MED ORDER — LISDEXAMFETAMINE DIMESYLATE 60 MG PO CAPS: 60.0000 mg | ORAL_CAPSULE | ORAL | 0 refills | Status: DC

## 2017-06-17 MED ORDER — LISDEXAMFETAMINE DIMESYLATE 60 MG PO CAPS
60.0000 mg | ORAL_CAPSULE | ORAL | 0 refills | Status: DC
Start: 2017-06-17 — End: 2019-02-16

## 2017-06-17 MED ORDER — METHYLPREDNISOLONE ACETATE 80 MG/ML IJ SUSP
80.0000 mg | Freq: Once | INTRAMUSCULAR | Status: AC
  Administered 2017-06-17: 80 mg via INTRAMUSCULAR

## 2017-06-17 MED ORDER — TRIAMCINOLONE ACETONIDE 0.5 % EX OINT: 1.0000 "application " | TOPICAL_OINTMENT | Freq: Two times a day (BID) | CUTANEOUS | 0 refills | Status: DC

## 2017-06-17 NOTE — Patient Instructions (Signed)

## 2017-06-17 NOTE — Progress Notes (Signed)
   Subjective:    Patient ID: Lorraine Snyder, female    DOB: 10/20/1985, 32 y.o.   MRN: [id number]  Chief Complaint  Patient presents with  . Rash    Rash  This is a new problem. The current episode started 1 to 4 weeks ago. The affected locations include the right upper leg, left upper leg, left arm, right lower leg, left lower leg, chest, torso and back. The rash is characterized by itchiness. Associated with: had a baby fox that she was caring for. Pertinent negatives include no congestion, cough, diarrhea, fatigue, fever, joint pain, shortness of breath or sore throat. Past treatments include topical steroids. The treatment provided moderate relief.  ADHD PT currently taking Vyvanse 60 mg and doing well. States this helps with her focus at work.     Review of Systems  Constitutional: Negative for fatigue and fever.  HENT: Negative for congestion and sore throat.   Respiratory: Negative for cough and shortness of breath.   Gastrointestinal: Negative for diarrhea.  Musculoskeletal: Negative for joint pain.  Skin: Positive for rash.  All other systems reviewed and are negative.      Objective:   Physical Exam  Constitutional: She is oriented to person, place, and time. She appears well-developed and well-nourished. No distress.  HENT:  Head: Normocephalic and atraumatic.  Right Ear: External ear normal.  Left Ear: External ear normal.  Mouth/Throat: Oropharynx is clear and moist.  Eyes: Pupils are equal, round, and reactive to light.  Neck: Normal range of motion. Neck supple. No thyromegaly present.  Cardiovascular: Normal rate, regular rhythm, normal heart sounds and intact distal pulses.  No murmur heard. Pulmonary/Chest: Effort normal and breath sounds normal. No respiratory distress. She has no wheezes.  Abdominal: Soft. Bowel sounds are normal. She exhibits no distension. There is no tenderness.  Musculoskeletal: Normal range of motion. She exhibits no edema or tenderness.    Neurological: She is alert and oriented to person, place, and time. She has normal reflexes.  Skin: Skin is warm and dry. There is erythema.  Scattered papule rash on bilateral legs, arms, abdomen  Psychiatric: [name]. Her behavior is normal. Judgment and thought content normal.  Vitals reviewed.    BP 132/87   Pulse 83   Temp 98.9 F (37.2 C) (Oral)   Ht 5\' 8"  (1.727 m)   Wt 151 lb 12.8 oz (68.9 kg)   BMI 23.08 kg/m      Assessment & Plan:  Lorraine Snyder comes in today with chief complaint of Rash   Diagnosis and orders addressed:  1. Contact dermatitis, unspecified contact dermatitis type, unspecified trigger Do not scratch Report any fevers - triamcinolone ointment (KENALOG) 0.5 %; Apply 1 application topically 2 (two) times daily.  Dispense: 60 g; Refill: 0 - methylPREDNISolone acetate (DEPO-MEDROL) injection 80 mg  2. Attention deficit hyperactivity disorder (ADHD), combined type Meds as prescribed Behavior modification as needed Follow-up for recheck in 3 months - lisdexamfetamine (VYVANSE) 60 MG capsule; Take 1 capsule (60 mg total) by mouth every morning.  Dispense: 30 capsule; Refill: 0 - lisdexamfetamine (VYVANSE) 60 MG capsule; Take 1 capsule (60 mg total) by mouth every morning.  Dispense: 30 capsule; Refill: 0 - lisdexamfetamine (VYVANSE) 60 MG capsule; Take 1 capsule (60 mg total) by mouth every morning.  Dispense: 30 capsule; Refill: 0      Evelina Dun, FNP

## 2017-06-19 ENCOUNTER — Ambulatory Visit: Payer: Self-pay | Admitting: Family

## 2017-06-19 ENCOUNTER — Other Ambulatory Visit: Payer: Self-pay | Admitting: Family

## 2017-06-19 ENCOUNTER — Encounter: Payer: Self-pay | Admitting: Family

## 2017-06-21 DIAGNOSIS — M4722 Other spondylosis with radiculopathy, cervical region: Secondary | ICD-10-CM | POA: Diagnosis not present

## 2017-06-21 DIAGNOSIS — M50123 Cervical disc disorder at C6-C7 level with radiculopathy: Secondary | ICD-10-CM | POA: Diagnosis not present

## 2017-06-21 DIAGNOSIS — M4312 Spondylolisthesis, cervical region: Secondary | ICD-10-CM | POA: Diagnosis not present

## 2017-06-21 DIAGNOSIS — M5412 Radiculopathy, cervical region: Secondary | ICD-10-CM | POA: Diagnosis not present

## 2017-07-02 DIAGNOSIS — M5013 Cervical disc disorder with radiculopathy, cervicothoracic region: Secondary | ICD-10-CM | POA: Diagnosis not present

## 2017-07-02 DIAGNOSIS — M50123 Cervical disc disorder at C6-C7 level with radiculopathy: Secondary | ICD-10-CM | POA: Diagnosis not present

## 2017-07-02 DIAGNOSIS — M50122 Cervical disc disorder at C5-C6 level with radiculopathy: Secondary | ICD-10-CM | POA: Diagnosis not present

## 2017-08-02 DIAGNOSIS — M50123 Cervical disc disorder at C6-C7 level with radiculopathy: Secondary | ICD-10-CM | POA: Diagnosis not present

## 2017-08-02 DIAGNOSIS — M50122 Cervical disc disorder at C5-C6 level with radiculopathy: Secondary | ICD-10-CM | POA: Diagnosis not present

## 2017-08-02 DIAGNOSIS — M4322 Fusion of spine, cervical region: Secondary | ICD-10-CM | POA: Diagnosis not present

## 2017-08-02 DIAGNOSIS — M5013 Cervical disc disorder with radiculopathy, cervicothoracic region: Secondary | ICD-10-CM | POA: Diagnosis not present

## 2017-09-12 ENCOUNTER — Ambulatory Visit (INDEPENDENT_AMBULATORY_CARE_PROVIDER_SITE_OTHER): Payer: BLUE CROSS/BLUE SHIELD | Admitting: Orthopaedic Surgery

## 2017-09-17 ENCOUNTER — Ambulatory Visit: Payer: BLUE CROSS/BLUE SHIELD | Admitting: Family

## 2017-09-18 ENCOUNTER — Ambulatory Visit (INDEPENDENT_AMBULATORY_CARE_PROVIDER_SITE_OTHER): Payer: BLUE CROSS/BLUE SHIELD | Admitting: Physician Assistant

## 2017-10-09 ENCOUNTER — Ambulatory Visit (INDEPENDENT_AMBULATORY_CARE_PROVIDER_SITE_OTHER): Payer: BLUE CROSS/BLUE SHIELD | Admitting: Orthopaedic Surgery

## 2017-10-23 ENCOUNTER — Ambulatory Visit (INDEPENDENT_AMBULATORY_CARE_PROVIDER_SITE_OTHER): Payer: BLUE CROSS/BLUE SHIELD | Admitting: Orthopaedic Surgery

## 2017-10-23 DIAGNOSIS — R001 Bradycardia, unspecified: Secondary | ICD-10-CM | POA: Diagnosis not present

## 2017-10-30 ENCOUNTER — Ambulatory Visit (INDEPENDENT_AMBULATORY_CARE_PROVIDER_SITE_OTHER): Payer: BLUE CROSS/BLUE SHIELD | Admitting: Orthopaedic Surgery

## 2017-11-08 DIAGNOSIS — Z6824 Body mass index (BMI) 24.0-24.9, adult: Secondary | ICD-10-CM | POA: Diagnosis not present

## 2017-11-08 DIAGNOSIS — B001 Herpesviral vesicular dermatitis: Secondary | ICD-10-CM | POA: Diagnosis not present

## 2017-11-08 DIAGNOSIS — L259 Unspecified contact dermatitis, unspecified cause: Secondary | ICD-10-CM | POA: Diagnosis not present

## 2018-03-26 IMAGING — MR MR KNEE*R* WO/W CM
4 of 9 series · 12 of 40 positions shown · IV contrast (agent unspecified)
Comparison: Knee radiographs from 08/07/2016

CLINICAL DATA: Right knee pain for 20 years.

EXAM:
MRI OF THE RIGHT KNEE WITHOUT AND WITH CONTRAST
TECHNIQUE: Multiplanar, multisequence MR imaging of the knee was performed. No
intravenous contrast was administered.

[Series 7: T1 · coronal · 3.0mm · 0.21mm/px · 3 of 24 slices shown (1 of 2)]
[im 1/24]
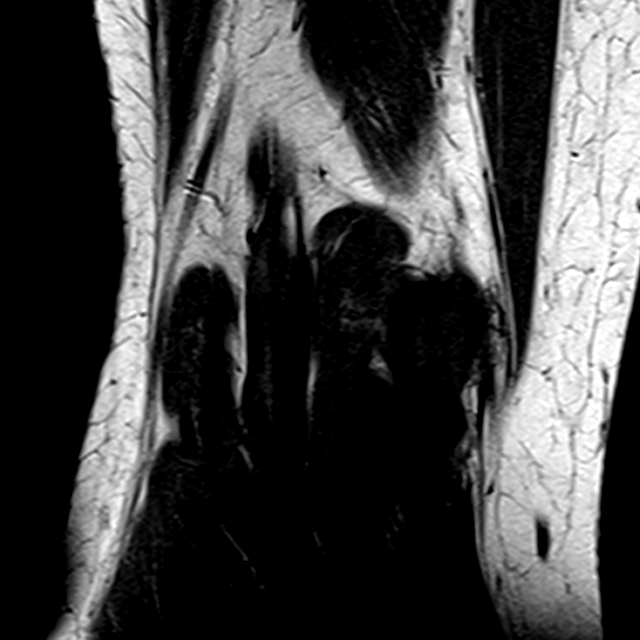
[im 16/24]
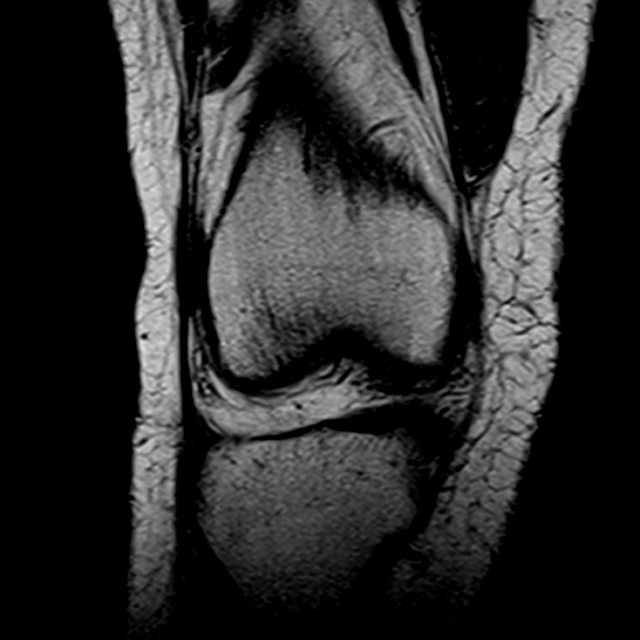
[im 24/24]
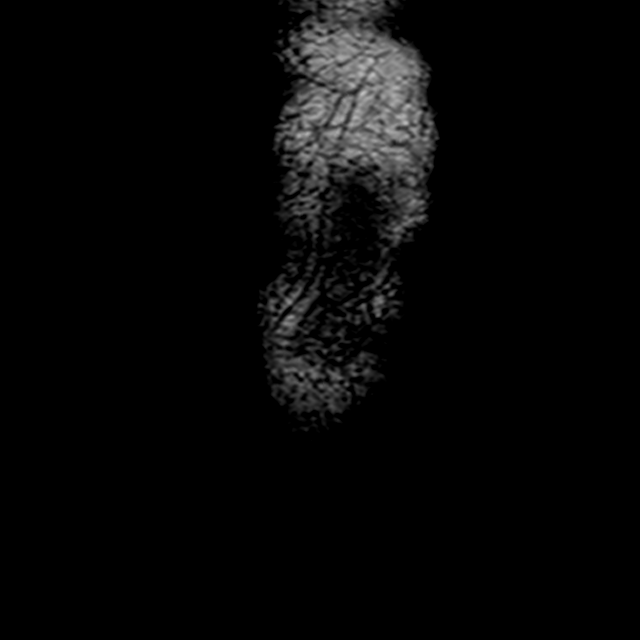

[Series 8: T1 · axial · 4.0mm · 0.24mm/px · z∈[-108,+41]mm · 3 of 32 slices shown (2 of 2)]
[im 1/32]
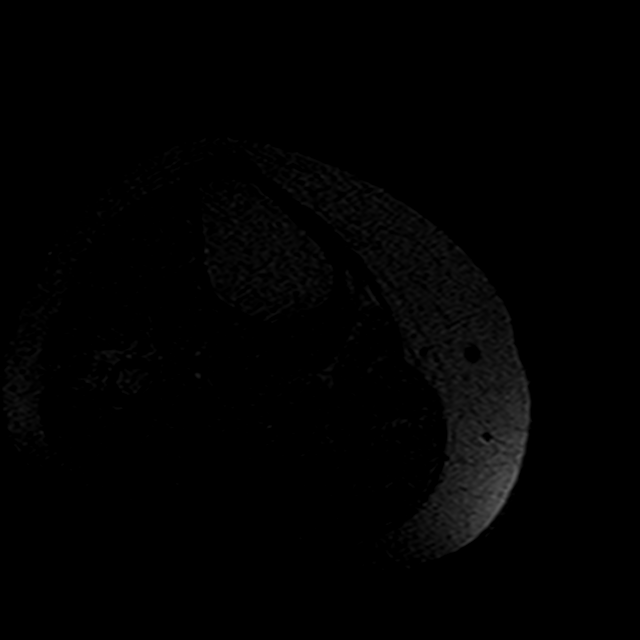
[im 16/32]
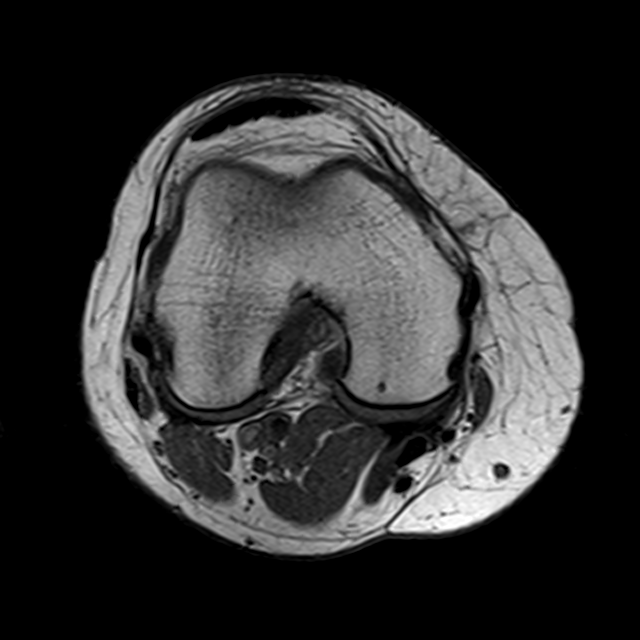
[im 32/32]
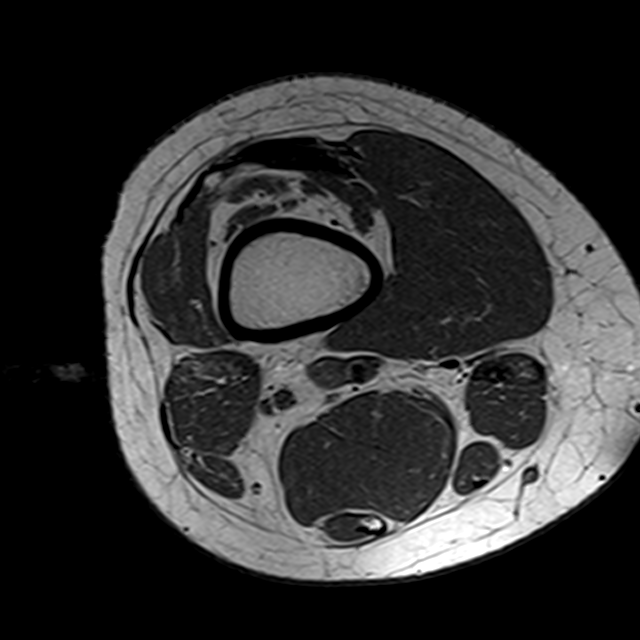

[Series 9: t1fs axial · axial · 4.0mm · 0.24mm/px · z∈[-108,+41]mm · 3 of 32 slices shown]
[im 1/32]
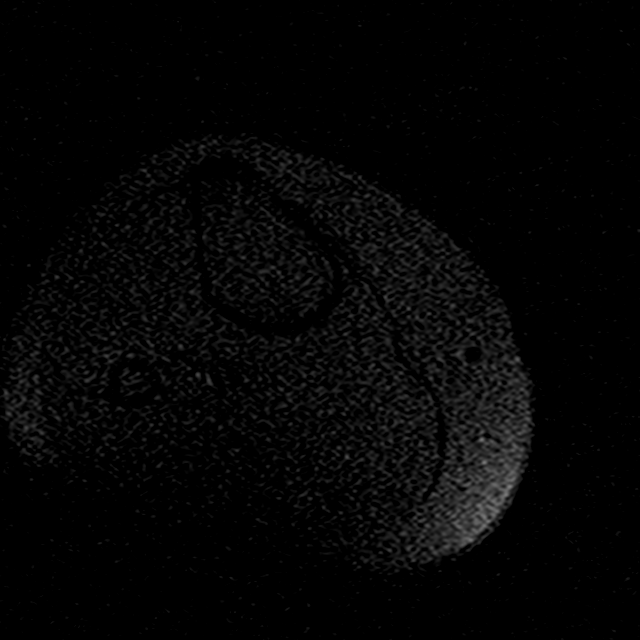
[im 16/32]
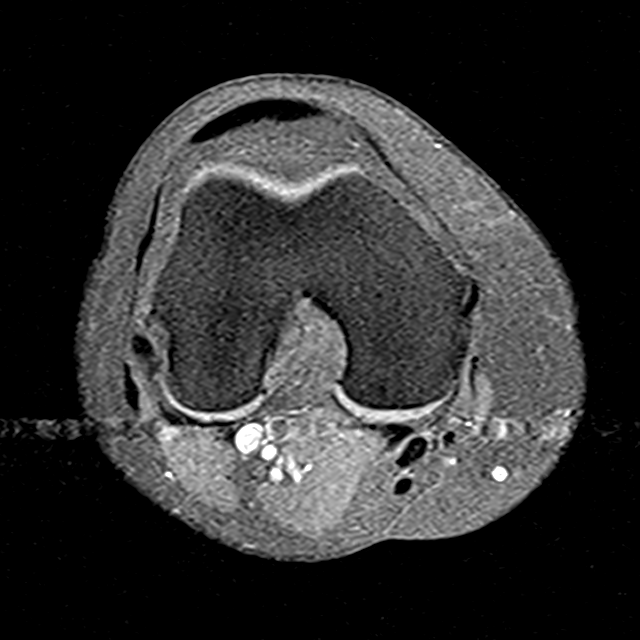
[im 32/32]
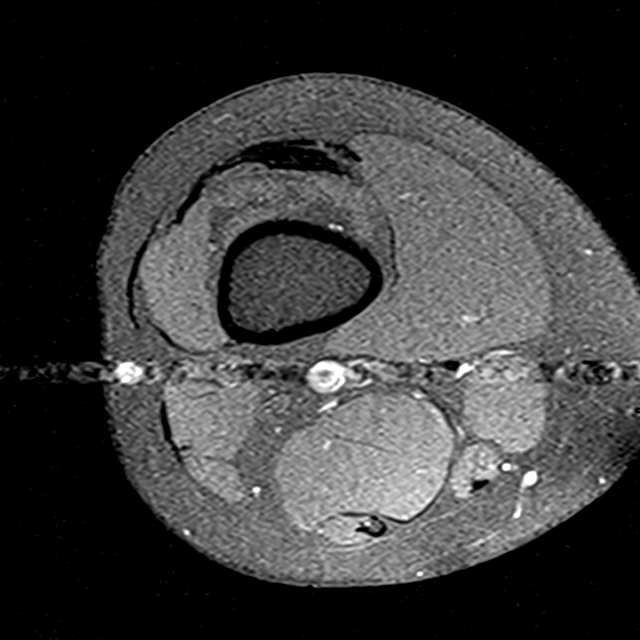

[Series 10: t2fs axial · axial · 4.0mm · 0.24mm/px · z∈[-108,+41]mm · 3 of 32 slices shown]
[im 1/32]
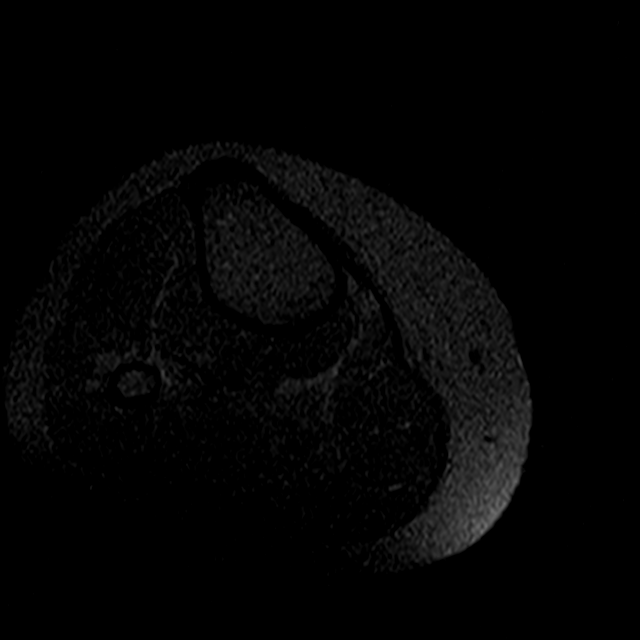
[im 16/32]
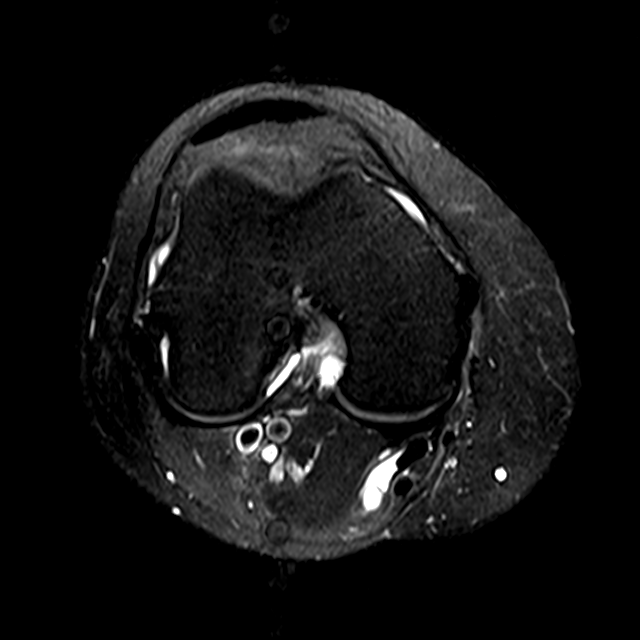
[im 32/32]
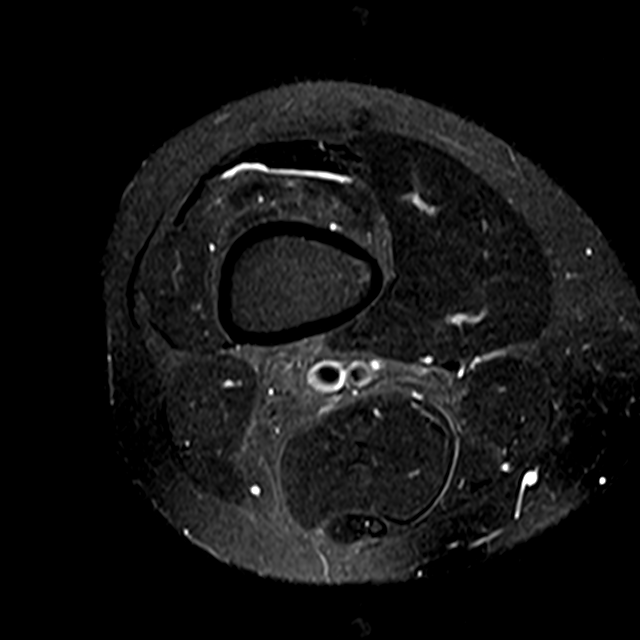

[12 of 40 positions shown; findings below may reference images not displayed]

FINDINGS: Today' s exam was performed using the bone tumor protocol rather
than the orthopedic knee protocol. Accordingly, detail regarding the
menisci, cruciate ligaments, and other structures is not available.

The cortical lesion of concern in the distal tibial metaphysis
posteriorly is a healed fibrous cortical defect, a benign lesion
requiring no further workup. Lesions size is 4.6 by 2.0 by 1.0 cm.
High internal T1 signal indicating fatty replacement.

There is an ossicle with a small amount of surrounding edema along
the distal patellar tendon, appearance compatible with remote
Osgood-Schlatter disease. The trace surrounding edema could
represent low-level inflammation.

The contours of the cruciate ligaments and collateral ligaments
appear normal. Popliteus tendon is intact. Mild tricompartmental
marginal spurring. Intact cartilaginous surfaces. Patellar
retinacula and quadriceps tendon unremarkable.

Trace knee effusion in the suprapatellar bursa. Small Baker's cyst
as shown on image [DATE].

There is a small amount of edema in Hoffa's fat pad just below the
lateral patellar facet. Tibial tubercle -trochlear groove distance
1.7 cm.
IMPRESSION: 1. The lesion of concern in the distal femoral metaphysis is a
benign fibrous cortical defect and requires no further workup.
2. There is evidence of remote Osgood-Schlatter disease, with an
ossicle in the distal patellar tendon. The ossicle is surrounded by
some edema which could reflect some low-grade chronic inflammation.
3. Mild tricompartmental spurring.
4. Trace knee effusion with focal synovitis just below the lateral
patellar facet compatible with patellar tendon-lateral femoral
condyle friction syndrome.
5. Borderline elevated tibial tubercle -trochlear groove distance at
1.7 cm.
6. Small Baker's cyst.
7. Today' s exam was performed using the soft tissue mass protocol
and not the orthopedic protocol. As result, the menisci are not
assessed in a diagnostic fashion.

## 2018-04-22 DIAGNOSIS — L259 Unspecified contact dermatitis, unspecified cause: Secondary | ICD-10-CM | POA: Diagnosis not present

## 2018-07-21 DIAGNOSIS — M9902 Segmental and somatic dysfunction of thoracic region: Secondary | ICD-10-CM | POA: Diagnosis not present

## 2018-07-21 DIAGNOSIS — M5414 Radiculopathy, thoracic region: Secondary | ICD-10-CM | POA: Diagnosis not present

## 2018-07-21 DIAGNOSIS — M62830 Muscle spasm of back: Secondary | ICD-10-CM | POA: Diagnosis not present

## 2018-07-21 DIAGNOSIS — M5384 Other specified dorsopathies, thoracic region: Secondary | ICD-10-CM | POA: Diagnosis not present

## 2018-07-22 DIAGNOSIS — M5384 Other specified dorsopathies, thoracic region: Secondary | ICD-10-CM | POA: Diagnosis not present

## 2018-07-22 DIAGNOSIS — M9902 Segmental and somatic dysfunction of thoracic region: Secondary | ICD-10-CM | POA: Diagnosis not present

## 2018-07-22 DIAGNOSIS — M62830 Muscle spasm of back: Secondary | ICD-10-CM | POA: Diagnosis not present

## 2018-07-22 DIAGNOSIS — M5414 Radiculopathy, thoracic region: Secondary | ICD-10-CM | POA: Diagnosis not present

## 2018-08-04 DIAGNOSIS — M5414 Radiculopathy, thoracic region: Secondary | ICD-10-CM | POA: Diagnosis not present

## 2018-08-04 DIAGNOSIS — M62830 Muscle spasm of back: Secondary | ICD-10-CM | POA: Diagnosis not present

## 2018-08-04 DIAGNOSIS — M5384 Other specified dorsopathies, thoracic region: Secondary | ICD-10-CM | POA: Diagnosis not present

## 2018-08-04 DIAGNOSIS — M9902 Segmental and somatic dysfunction of thoracic region: Secondary | ICD-10-CM | POA: Diagnosis not present

## 2018-12-08 DIAGNOSIS — Z20828 Contact with and (suspected) exposure to other viral communicable diseases: Secondary | ICD-10-CM | POA: Diagnosis not present

## 2019-01-16 DIAGNOSIS — L255 Unspecified contact dermatitis due to plants, except food: Secondary | ICD-10-CM | POA: Diagnosis not present

## 2019-02-16 ENCOUNTER — Other Ambulatory Visit: Payer: Self-pay

## 2019-02-16 ENCOUNTER — Encounter: Payer: Self-pay | Admitting: Obstetrics & Gynecology

## 2019-02-16 ENCOUNTER — Ambulatory Visit (INDEPENDENT_AMBULATORY_CARE_PROVIDER_SITE_OTHER): Payer: BC Managed Care – PPO | Admitting: Obstetrics & Gynecology

## 2019-02-16 ENCOUNTER — Other Ambulatory Visit (HOSPITAL_COMMUNITY)
Admission: RE | Admit: 2019-02-16 | Discharge: 2019-02-16 | Disposition: A | Discharge status: Home / Self Care | Payer: BC Managed Care – PPO | Source: Ambulatory Visit | Attending: Obstetrics & Gynecology | Admitting: Obstetrics & Gynecology

## 2019-02-16 VITALS — BP 113/79 | HR 63 | Ht 68.0 in | Wt 171.0 lb

## 2019-02-16 DIAGNOSIS — Z01419 Encounter for gynecological examination (general) (routine) without abnormal findings: Secondary | ICD-10-CM | POA: Diagnosis not present

## 2019-02-16 NOTE — Progress Notes (Signed)
Subjective:     Lorraine Snyder is a 34 y.o. female here for a routine exam.  No LMP recorded. Patient has had an ablation. G1P0010 Birth Control Method:  Bilateral salpingectomy Menstrual Calendar(currently): regular very light light cramps Current complaints: rare episodic severe stabbing pain, like once a month resolves in less than 2 minutes.   Current acute medical issues:     Recent Gynecologic History No LMP recorded. Patient has had an ablation. Last Pap: 2017,  normal Last mammogram: ,    Past Medical History:  Diagnosis Date  . History of stomach ulcers     Past Surgical History:  Procedure Laterality Date  . DILITATION & CURRETTAGE/HYSTROSCOPY WITH NOVASURE ABLATION N/A 01/25/2017   Procedure: DILATATION & CURETTAGE/HYSTEROSCOPY WITH NOVASURE ABLATION;  Surgeon: Florian Buff, MD;  Location: [facility];  Service: Gynecology;  Laterality: N/A;  time: 1 minute 10 seconds ; power:99 ; cavity length:6.0 ; cavity width: 3.0  . KNEE ARTHROSCOPY    . LAPAROSCOPIC BILATERAL SALPINGECTOMY Bilateral 01/25/2017   Procedure: LAPAROSCOPIC BILATERAL SALPINGECTOMY;  Surgeon: Florian Buff, MD;  Location: [facility];  Service: Gynecology;  Laterality: Bilateral;  . NECK SURGERY      OB History    Gravida  1   Para      Term      Preterm      AB  1   Living  0     SAB      TAB  1   Ectopic      Multiple      Live Births              Social History   Socioeconomic History  . Marital status: Married    Spouse name: Not on file  . Number of children: Not on file  . Years of education: Not on file  . Highest education level: Not on file  Occupational History  . Not on file  Tobacco Use  . Smoking status: Current Every Day Smoker    Types: E-cigarettes  . Smokeless tobacco: Never Used  Substance and Sexual Activity  . Alcohol use: Yes    Alcohol/week: 6.0 standard drinks    Types: 6 Cans of beer per week  . Drug use: No  . Sexual activity: Yes    Birth  control/protection: Surgical    Comment: vasectomy  Other Topics Concern  . Not on file  Social History Narrative  . Not on file   Social Determinants of Health   Financial Resource Strain:   . Difficulty of Paying Living Expenses: Not on file  Food Insecurity:   . Worried About Charity fundraiser in the Last Year: Not on file  . Ran Out of Food in the Last Year: Not on file  Transportation Needs:   . Lack of Transportation (Medical): Not on file  . Lack of Transportation (Non-Medical): Not on file  Physical Activity:   . Days of Exercise per Week: Not on file  . Minutes of Exercise per Session: Not on file  Stress:   . Feeling of Stress : Not on file  Social Connections:   . Frequency of Communication with Friends and Family: Not on file  . Frequency of Social Gatherings with Friends and Family: Not on file  . Attends Religious Services: Not on file  . Active Member of Clubs or Organizations: Not on file  . Attends Archivist Meetings: Not on file  . Marital Status: Not on  file    Family History  Problem Relation Age of Onset  . Arthritis Father   . Diabetes Father   . Diabetes Sister   . Cancer Sister   . Diabetes Paternal Grandmother   . Heart attack Paternal Grandfather   . Cancer Maternal Grandmother        ovarian  . Alzheimer's disease Maternal Grandfather   . Hypertension Mother      Current Outpatient Medications:  .  Buprenorphine HCl-Naloxone HCl 8-2 MG FILM, buprenorphine 8 mg-naloxone 2 mg sublingual film  1 film TID, Disp: , Rfl:  .  gabapentin (NEURONTIN) 600 MG tablet, Take 600-1,200 mg by mouth 3 (three) times daily., Disp: , Rfl:  .  ibuprofen (ADVIL,MOTRIN) 800 MG tablet, Take 800 mg by mouth every 8 (eight) hours as needed for mild pain or moderate pain., Disp: , Rfl:   Review of Systems  Review of Systems  Constitutional: Negative for fever, chills, weight loss, malaise/fatigue and diaphoresis.  HENT: Negative for hearing loss,  ear pain, nosebleeds, congestion, sore throat, neck pain, tinnitus and ear discharge.   Eyes: Negative for blurred vision, double vision, photophobia, pain, discharge and redness.  Respiratory: Negative for cough, hemoptysis, sputum production, shortness of breath, wheezing and stridor.   Cardiovascular: Negative for chest pain, palpitations, orthopnea, claudication, leg swelling and PND.  Gastrointestinal: negative for abdominal pain. Negative for heartburn, nausea, vomiting, diarrhea, constipation, blood in stool and melena.  Genitourinary: Negative for dysuria, urgency, frequency, hematuria and flank pain.  Musculoskeletal: Negative for myalgias, back pain, joint pain and falls.  Skin: Negative for itching and rash.  Neurological: Negative for dizziness, tingling, tremors, sensory change, speech change, focal weakness, seizures, loss of consciousness, weakness and headaches.  Endo/Heme/Allergies: Negative for environmental allergies and polydipsia. Does not bruise/bleed easily.  Psychiatric/Behavioral: Negative for depression, suicidal ideas, hallucinations, memory loss and substance abuse. The patient is not nervous/anxious and does not have insomnia.        Objective:  Blood pressure 113/79, pulse 63, height 5\' 8"  (1.727 m), weight 171 lb (77.6 kg).   Physical Exam  Vitals reviewed. Constitutional: She is oriented to person, place, and time. She appears well-developed and well-nourished.  HENT:  Head: Normocephalic and atraumatic.        Right Ear: External ear normal.  Left Ear: External ear normal.  Nose: Nose normal.  Mouth/Throat: Oropharynx is clear and moist.  Eyes: Conjunctivae and EOM are normal. Pupils are equal, round, and reactive to light. Right eye exhibits no discharge. Left eye exhibits no discharge. No scleral icterus.  Neck: Normal range of motion. Neck supple. No tracheal deviation present. No thyromegaly present.  Cardiovascular: Normal rate, regular rhythm, normal  heart sounds and intact distal pulses.  Exam reveals no gallop and no friction rub.   No murmur heard. Respiratory: Effort normal and breath sounds normal. No respiratory distress. She has no wheezes. She has no rales. She exhibits no tenderness.  GI: Soft. Bowel sounds are normal. She exhibits no distension and no mass. There is no tenderness. There is no rebound and no guarding.  Genitourinary:  Breasts no masses skin changes or nipple changes bilaterally      Vulva is normal without lesions Vagina is pink moist without discharge Cervix normal in appearance and pap is done Uterus is normal size shape and contour Adnexa is negative with normal sized ovaries   Musculoskeletal: Normal range of motion. She exhibits no edema and no tenderness.  Neurological: She is alert and  oriented to person, place, and time. She has normal reflexes. She displays normal reflexes. No cranial nerve deficit. She exhibits normal muscle tone. Coordination normal.  Skin: Skin is warm and dry. No rash noted. No erythema. No pallor.  Psychiatric: [name]. Her behavior is normal. Judgment and thought content normal.       Medications Ordered at today's visit: No orders of the defined types were placed in this encounter.   Other orders placed at today's visit: No orders of the defined types were placed in this encounter.     Assessment:    Healthy female exam.    Plan:    Contraception: tubal ligation. Follow up in: 1 week.     Return in about 3 years (around 02/15/2022) for yearly.

## 2019-02-19 LAB — CYTOLOGY - PAP
Chlamydia: NEGATIVE
Comment: NEGATIVE
Comment: NEGATIVE
Comment: NORMAL
Diagnosis: NEGATIVE
High risk HPV: NEGATIVE
Neisseria Gonorrhea: NEGATIVE

## 2019-04-22 DIAGNOSIS — M5384 Other specified dorsopathies, thoracic region: Secondary | ICD-10-CM | POA: Diagnosis not present

## 2019-04-22 DIAGNOSIS — M9902 Segmental and somatic dysfunction of thoracic region: Secondary | ICD-10-CM | POA: Diagnosis not present

## 2019-04-22 DIAGNOSIS — M5382 Other specified dorsopathies, cervical region: Secondary | ICD-10-CM | POA: Diagnosis not present

## 2019-04-22 DIAGNOSIS — M9901 Segmental and somatic dysfunction of cervical region: Secondary | ICD-10-CM | POA: Diagnosis not present

## 2019-04-30 DIAGNOSIS — M5384 Other specified dorsopathies, thoracic region: Secondary | ICD-10-CM | POA: Diagnosis not present

## 2019-04-30 DIAGNOSIS — M5382 Other specified dorsopathies, cervical region: Secondary | ICD-10-CM | POA: Diagnosis not present

## 2019-04-30 DIAGNOSIS — M9901 Segmental and somatic dysfunction of cervical region: Secondary | ICD-10-CM | POA: Diagnosis not present

## 2019-04-30 DIAGNOSIS — M9902 Segmental and somatic dysfunction of thoracic region: Secondary | ICD-10-CM | POA: Diagnosis not present

## 2019-05-07 DIAGNOSIS — M5384 Other specified dorsopathies, thoracic region: Secondary | ICD-10-CM | POA: Diagnosis not present

## 2019-05-07 DIAGNOSIS — M9902 Segmental and somatic dysfunction of thoracic region: Secondary | ICD-10-CM | POA: Diagnosis not present

## 2019-05-07 DIAGNOSIS — M5382 Other specified dorsopathies, cervical region: Secondary | ICD-10-CM | POA: Diagnosis not present

## 2019-05-07 DIAGNOSIS — M9901 Segmental and somatic dysfunction of cervical region: Secondary | ICD-10-CM | POA: Diagnosis not present

## 2019-05-18 DIAGNOSIS — M179 Osteoarthritis of knee, unspecified: Secondary | ICD-10-CM | POA: Diagnosis not present

## 2019-05-18 DIAGNOSIS — M25561 Pain in right knee: Secondary | ICD-10-CM | POA: Diagnosis not present

## 2019-05-21 DIAGNOSIS — M5382 Other specified dorsopathies, cervical region: Secondary | ICD-10-CM | POA: Diagnosis not present

## 2019-05-21 DIAGNOSIS — M9901 Segmental and somatic dysfunction of cervical region: Secondary | ICD-10-CM | POA: Diagnosis not present

## 2019-05-21 DIAGNOSIS — M9902 Segmental and somatic dysfunction of thoracic region: Secondary | ICD-10-CM | POA: Diagnosis not present

## 2019-05-21 DIAGNOSIS — M5384 Other specified dorsopathies, thoracic region: Secondary | ICD-10-CM | POA: Diagnosis not present

## 2019-05-28 DIAGNOSIS — M25561 Pain in right knee: Secondary | ICD-10-CM | POA: Diagnosis not present

## 2019-05-28 DIAGNOSIS — M629 Disorder of muscle, unspecified: Secondary | ICD-10-CM | POA: Diagnosis not present

## 2019-05-28 DIAGNOSIS — M25361 Other instability, right knee: Secondary | ICD-10-CM | POA: Diagnosis not present

## 2019-05-28 DIAGNOSIS — M25461 Effusion, right knee: Secondary | ICD-10-CM | POA: Diagnosis not present

## 2019-06-09 DIAGNOSIS — M25461 Effusion, right knee: Secondary | ICD-10-CM | POA: Diagnosis not present

## 2019-06-09 DIAGNOSIS — M629 Disorder of muscle, unspecified: Secondary | ICD-10-CM | POA: Diagnosis not present

## 2019-06-09 DIAGNOSIS — M25561 Pain in right knee: Secondary | ICD-10-CM | POA: Diagnosis not present

## 2019-06-09 DIAGNOSIS — M25361 Other instability, right knee: Secondary | ICD-10-CM | POA: Diagnosis not present
